# Patient Record
Sex: Female | Born: 1940 | Race: White | Hispanic: No | Smoking: Never smoker
Health system: Southern US, Community
[De-identification: ages and names within clinical notes are randomized; demographics above are authoritative.]

## PROBLEM LIST (undated history)

## (undated) DIAGNOSIS — U071 COVID-19: Secondary | ICD-10-CM

## (undated) DIAGNOSIS — E785 Hyperlipidemia, unspecified: Secondary | ICD-10-CM

## (undated) DIAGNOSIS — T8859XA Other complications of anesthesia, initial encounter: Secondary | ICD-10-CM

## (undated) DIAGNOSIS — O039 Complete or unspecified spontaneous abortion without complication: Secondary | ICD-10-CM

## (undated) DIAGNOSIS — F329 Major depressive disorder, single episode, unspecified: Secondary | ICD-10-CM

## (undated) DIAGNOSIS — B2790 Infectious mononucleosis, unspecified without complication: Secondary | ICD-10-CM

## (undated) DIAGNOSIS — C4490 Unspecified malignant neoplasm of skin, unspecified: Secondary | ICD-10-CM

## (undated) DIAGNOSIS — A281 Cat-scratch disease: Secondary | ICD-10-CM

## (undated) DIAGNOSIS — T4145XA Adverse effect of unspecified anesthetic, initial encounter: Secondary | ICD-10-CM

## (undated) DIAGNOSIS — B059 Measles without complication: Secondary | ICD-10-CM

## (undated) DIAGNOSIS — M810 Age-related osteoporosis without current pathological fracture: Secondary | ICD-10-CM

## (undated) DIAGNOSIS — N390 Urinary tract infection, site not specified: Secondary | ICD-10-CM

## (undated) DIAGNOSIS — R5382 Chronic fatigue, unspecified: Secondary | ICD-10-CM

## (undated) DIAGNOSIS — K311 Adult hypertrophic pyloric stenosis: Secondary | ICD-10-CM

## (undated) DIAGNOSIS — K5190 Ulcerative colitis, unspecified, without complications: Secondary | ICD-10-CM

## (undated) DIAGNOSIS — J329 Chronic sinusitis, unspecified: Secondary | ICD-10-CM

## (undated) DIAGNOSIS — K51919 Ulcerative colitis, unspecified with unspecified complications: Secondary | ICD-10-CM

## (undated) DIAGNOSIS — G43709 Chronic migraine without aura, not intractable, without status migrainosus: Secondary | ICD-10-CM

## (undated) DIAGNOSIS — R7303 Prediabetes: Secondary | ICD-10-CM

## (undated) DIAGNOSIS — Z6711 Type A blood, Rh negative: Secondary | ICD-10-CM

## (undated) DIAGNOSIS — F32A Depression, unspecified: Secondary | ICD-10-CM

## (undated) DIAGNOSIS — K219 Gastro-esophageal reflux disease without esophagitis: Secondary | ICD-10-CM

## (undated) DIAGNOSIS — K529 Noninfective gastroenteritis and colitis, unspecified: Secondary | ICD-10-CM

## (undated) DIAGNOSIS — IMO0002 Reserved for concepts with insufficient information to code with codable children: Secondary | ICD-10-CM

## (undated) DIAGNOSIS — N809 Endometriosis, unspecified: Secondary | ICD-10-CM

## (undated) HISTORY — DX: Depression, unspecified: F32.A

## (undated) HISTORY — DX: Major depressive disorder, single episode, unspecified: F32.9

## (undated) HISTORY — DX: Chronic migraine without aura, not intractable, without status migrainosus: G43.709

## (undated) HISTORY — DX: COVID-19: U07.1

## (undated) HISTORY — PX: CATARACT EXTRACTION: SUR2

## (undated) HISTORY — DX: Unspecified malignant neoplasm of skin, unspecified: C44.90

## (undated) HISTORY — DX: Ulcerative colitis, unspecified, without complications: K51.90

## (undated) HISTORY — PX: OTHER SURGICAL HISTORY: SHX169

## (undated) HISTORY — DX: Infectious mononucleosis, unspecified without complication: B27.90

## (undated) HISTORY — DX: Noninfective gastroenteritis and colitis, unspecified: K52.9

## (undated) HISTORY — DX: Complete or unspecified spontaneous abortion without complication: O03.9

## (undated) HISTORY — DX: Gastro-esophageal reflux disease without esophagitis: K21.9

## (undated) HISTORY — DX: Cat-scratch disease: A28.1

## (undated) HISTORY — DX: Chronic sinusitis, unspecified: J32.9

## (undated) HISTORY — DX: Hyperlipidemia, unspecified: E78.5

## (undated) HISTORY — DX: Hemochromatosis, unspecified: E83.119

## (undated) HISTORY — DX: Age-related osteoporosis without current pathological fracture: M81.0

## (undated) HISTORY — DX: Type A blood, Rh negative: Z67.11

## (undated) HISTORY — DX: Adult hypertrophic pyloric stenosis: K31.1

## (undated) HISTORY — DX: Prediabetes: R73.03

## (undated) HISTORY — DX: Urinary tract infection, site not specified: N39.0

## (undated) HISTORY — DX: Measles without complication: B05.9

## (undated) HISTORY — DX: Ulcerative colitis, unspecified with unspecified complications: K51.919

## (undated) HISTORY — DX: Chronic fatigue, unspecified: R53.82

## (undated) HISTORY — DX: Reserved for concepts with insufficient information to code with codable children: IMO0002

## (undated) HISTORY — DX: Endometriosis, unspecified: N80.9

---

## 2005-02-04 ENCOUNTER — Ambulatory Visit: Payer: Self-pay | Admitting: Internal Medicine

## 2007-01-12 ENCOUNTER — Ambulatory Visit: Payer: Self-pay | Admitting: Obstetrics and Gynecology

## 2008-01-16 ENCOUNTER — Ambulatory Visit: Payer: Self-pay | Admitting: Obstetrics and Gynecology

## 2008-07-19 DIAGNOSIS — C4492 Squamous cell carcinoma of skin, unspecified: Secondary | ICD-10-CM

## 2008-07-19 HISTORY — DX: Squamous cell carcinoma of skin, unspecified: C44.92

## 2008-07-24 ENCOUNTER — Ambulatory Visit: Payer: Self-pay | Admitting: Gastroenterology

## 2009-01-22 ENCOUNTER — Ambulatory Visit: Payer: Self-pay | Admitting: Obstetrics and Gynecology

## 2010-06-29 ENCOUNTER — Ambulatory Visit: Payer: Self-pay | Admitting: Obstetrics and Gynecology

## 2011-05-27 ENCOUNTER — Ambulatory Visit: Payer: Self-pay | Admitting: Ophthalmology

## 2011-06-07 ENCOUNTER — Ambulatory Visit: Payer: Self-pay | Admitting: Ophthalmology

## 2011-06-21 ENCOUNTER — Ambulatory Visit: Payer: Self-pay | Admitting: Ophthalmology

## 2011-09-16 ENCOUNTER — Ambulatory Visit: Payer: Self-pay | Admitting: Obstetrics and Gynecology

## 2014-04-23 DIAGNOSIS — C4491 Basal cell carcinoma of skin, unspecified: Secondary | ICD-10-CM

## 2014-04-23 HISTORY — DX: Basal cell carcinoma of skin, unspecified: C44.91

## 2014-09-01 NOTE — Op Note (Signed)
PATIENT NAME:  Jamie Todd, Jamie Todd MR#:  [id number] DATE OF BIRTH:  August 20, 1940  DATE OF PROCEDURE:  06/07/2011  PREOPERATIVE DIAGNOSIS:  Cataract, right eye.   POSTOPERATIVE DIAGNOSIS:  Cataract, right eye.  PROCEDURE PERFORMED: Extracapsular cataract extraction using phacoemulsification with placement of an Alcon SN6CWS, 11.0-diopter posterior chamber lens, serial # [id number].  SURGEON:  Loura Back. Maple Odaniel, MD  ASSISTANT:  None.  ANESTHESIA:  4% lidocaine and 0.75% Marcaine in a 50/50 mixture with 10 units per mL of Hylenex added, given as a peribulbar.  ANESTHESIOLOGIST:  Dr. Marcello Moores   COMPLICATIONS:  None.  ESTIMATED BLOOD LOSS:  Less than 1 mL.  DESCRIPTION OF PROCEDURE:  The patient was brought to the operating room and given a peribulbar block.  The patient was then prepped and draped in the usual fashion.  The vertical rectus muscles were imbricated using 5-0 silk sutures.  These sutures were then clamped to the sterile drapes as bridle sutures.  A limbal peritomy was performed extending two clock hours and hemostasis was obtained with cautery.  A partial thickness scleral groove was made at the surgical limbus and dissected anteriorly in a lamellar dissection using an Alcon crescent knife.  The anterior chamber was entered superonasally with a Superblade and through the lamellar dissection with a 2.6 mm keratome.  DisCoVisc was used to replace the aqueous and a continuous tear capsulorrhexis was carried out.  Hydrodissection and hydrodelineation were carried out with balanced salt and a 27 gauge canula.  The nucleus was rotated to confirm the effectiveness of the hydrodissection.  Phacoemulsification was carried out using a divide-and-conquer technique.  Total ultrasound time was 1 minute and 50 seconds with an average power of 20.9 percent.  Irrigation/aspiration was used to remove the residual cortex.  DisCoVisc was used to inflate the capsule and the internal incision was  enlarged to 3 mm with the crescent knife.  The intraocular lens was folded and inserted into the capsular bag using the Acrysert delivery system.  Irrigation/aspiration was used to remove the residual DisCoVisc.  Miostat was injected into the anterior chamber through the paracentesis track to inflate the anterior chamber and induce miosis.  The wound was checked for leaks and none were found. The conjunctiva was closed with cautery and the bridle sutures were removed.  Two drops of 0.3% Vigamox were placed on the eye.   An eye shield was placed on the eye.  The patient was discharged to the recovery room in good condition.  ____________________________ Loura Back Jahseh Lucchese, MD sad:drc D: 06/07/2011 13:11:02 ET T: 06/07/2011 13:43:50 ET JOB#: [id number]  cc: Remo Lipps A. Taiesha Bovard, MD, <Dictator> Martie Lee MD ELECTRONICALLY SIGNED 06/14/2011 12:24

## 2014-09-01 NOTE — Op Note (Signed)
PATIENT NAME:  Jamie Todd, Jamie Todd MR#:  [id number] DATE OF BIRTH:  1940-05-19  DATE OF PROCEDURE:  06/21/2011  PREOPERATIVE DIAGNOSIS:  Cataract, left eye.    POSTOPERATIVE DIAGNOSIS:  Cataract, left eye.  PROCEDURE PERFORMED:  Extracapsular cataract extraction using phacoemulsification with placement of an Alcon SN6CWS 11.5-diopter posterior chamber lens, serial # [id number].  SURGEON:  Loura Back. Milica Gully, MD  ASSISTANT:  None.  ANESTHESIA:  4% lidocaine and 0.75% Marcaine in a 50/50 mixture with 10 units/mL of Hylenex added, given as a peribulbar per mL.  ANESTHESIOLOGIST:  Dr. Benjamine Mola.  COMPLICATIONS:  None.  ESTIMATED BLOOD LOSS:  Less than 1 mL.  DESCRIPTION OF PROCEDURE:  The patient was brought to the operating room and given a peribulbar block.  The patient was then prepped and draped in the usual fashion.  The vertical rectus muscles were imbricated using 5-0 silk sutures.  These sutures were then clamped to the sterile drapes as bridle sutures.  A limbal peritomy was performed extending two clock hours and hemostasis was obtained with cautery.  A partial thickness scleral groove was made at the surgical limbus and dissected anteriorly in a lamellar dissection using an Alcon crescent knife.  The anterior chamber was entered supero-temporally with a Superblade and through the lamellar dissection with a 2.6 mm keratome.  DisCoVisc was used to replace the aqueous and a continuous tear capsulorrhexis was carried out.  Hydrodissection and hydrodelineation were carried out with balanced salt and a 27 gauge canula.  The nucleus was rotated to confirm the effectiveness of the hydrodissection.  Phacoemulsification was carried out using a divide-and-conquer technique.  Total ultrasound time was 1 minute and 24 seconds with an average power of 19.9 percent.  Irrigation/aspiration was used to remove the residual cortex.  DisCoVisc was used to inflate the capsule and the internal incision was  enlarged to 3 mm with the crescent knife.  The intraocular lens was folded and inserted into the capsular bag using the AcrySert delivery system.  Irrigation/aspiration was used to remove the residual DisCoVisc.  Miostat was injected into the anterior chamber through the paracentesis track to inflate the anterior chamber and induce miosis.  The wound was checked for leaks and none were found. The conjunctiva was closed with cautery and the bridle sutures were removed.  Two drops of 0.3% Vigamox were placed on the eye.   An eye shield was placed on the eye.  The patient was discharged to the recovery room in good condition.  ____________________________ Loura Back Neo Yepiz, MD sad:cms D: 06/21/2011 13:04:26 ET T: 06/21/2011 13:40:49 ET JOB#: [id number]  cc: Remo Lipps A. Ameriah Lint, MD, <Dictator> Martie Lee MD ELECTRONICALLY SIGNED 06/28/2011 13:44

## 2017-05-27 ENCOUNTER — Ambulatory Visit: Payer: Medicare Other | Admitting: Internal Medicine

## 2017-05-27 VITALS — BP 140/78 | HR 100 | Temp 98.5°F | Resp 16 | Ht 60.0 in | Wt 119.2 lb

## 2017-05-27 DIAGNOSIS — Z87898 Personal history of other specified conditions: Secondary | ICD-10-CM | POA: Diagnosis not present

## 2017-05-27 DIAGNOSIS — N644 Mastodynia: Secondary | ICD-10-CM

## 2017-05-27 DIAGNOSIS — Z8719 Personal history of other diseases of the digestive system: Secondary | ICD-10-CM | POA: Diagnosis not present

## 2017-05-27 DIAGNOSIS — R5382 Chronic fatigue, unspecified: Secondary | ICD-10-CM

## 2017-05-27 DIAGNOSIS — E559 Vitamin D deficiency, unspecified: Secondary | ICD-10-CM

## 2017-05-27 DIAGNOSIS — G4700 Insomnia, unspecified: Secondary | ICD-10-CM

## 2017-05-27 DIAGNOSIS — Z1159 Encounter for screening for other viral diseases: Secondary | ICD-10-CM | POA: Diagnosis not present

## 2017-05-27 DIAGNOSIS — Z85828 Personal history of other malignant neoplasm of skin: Secondary | ICD-10-CM | POA: Diagnosis not present

## 2017-05-27 DIAGNOSIS — Z13818 Encounter for screening for other digestive system disorders: Secondary | ICD-10-CM

## 2017-05-27 DIAGNOSIS — M810 Age-related osteoporosis without current pathological fracture: Secondary | ICD-10-CM

## 2017-05-27 DIAGNOSIS — K219 Gastro-esophageal reflux disease without esophagitis: Secondary | ICD-10-CM

## 2017-05-27 DIAGNOSIS — J329 Chronic sinusitis, unspecified: Secondary | ICD-10-CM | POA: Diagnosis not present

## 2017-05-27 DIAGNOSIS — R6883 Chills (without fever): Secondary | ICD-10-CM

## 2017-05-27 DIAGNOSIS — Z13220 Encounter for screening for lipoid disorders: Secondary | ICD-10-CM | POA: Diagnosis not present

## 2017-05-27 NOTE — Patient Instructions (Addendum)
We will refer you for mammogram, bone density and colonoscopy with Dr. Allen Norris Fasting labs Monday schedule at check out  no food only water x 12 hours  F/u in 4-6 weeks  Take care  Please stop Zyrtec D only do Zyrtec at night  Stress and Stress Management Stress is a normal reaction to life events. It is what you feel when life demands more than you are used to or more than you can handle. Some stress can be useful. For example, the stress reaction can help you catch the last bus of the day, study for a test, or meet a deadline at work. But stress that occurs too often or for too long can cause problems. It can affect your emotional health and interfere with relationships and normal daily activities. Too much stress can weaken your immune system and increase your risk for physical illness. If you already have a medical problem, stress can make it worse. What are the causes? All sorts of life events may cause stress. An event that causes stress for one person may not be stressful for another person. Major life events commonly cause stress. These may be positive or negative. Examples include losing your job, moving into a new home, getting married, having a baby, or losing a loved one. Less obvious life events may also cause stress, especially if they occur day after day or in combination. Examples include working long hours, driving in traffic, caring for children, being in debt, or being in a difficult relationship. What are the signs or symptoms? Stress may cause emotional symptoms including, the following:  Anxiety. This is feeling worried, afraid, on edge, overwhelmed, or out of control.  Anger. This is feeling irritated or impatient.  Depression. This is feeling sad, down, helpless, or guilty.  Difficulty focusing, remembering, or making decisions.  Stress may cause physical symptoms, including the following:  Aches and pains. These may affect your head, neck, back, stomach, or other areas  of your body.  Tight muscles or clenched jaw.  Low energy or trouble sleeping.  Stress may cause unhealthy behaviors, including the following:  Eating to feel better (overeating) or skipping meals.  Sleeping too little, too much, or both.  Working too much or putting off tasks (procrastination).  Smoking, drinking alcohol, or using drugs to feel better.  How is this diagnosed? Stress is diagnosed through an assessment by your health care provider. Your health care provider will ask questions about your symptoms and any stressful life events.Your health care provider will also ask about your medical history and may order blood tests or other tests. Certain medical conditions and medicine can cause physical symptoms similar to stress. Mental illness can cause emotional symptoms and unhealthy behaviors similar to stress. Your health care provider may refer you to a mental health professional for further evaluation. How is this treated? Stress management is the recommended treatment for stress.The goals of stress management are reducing stressful life events and coping with stress in healthy ways. Techniques for reducing stressful life events include the following:  Stress identification. Self-monitor for stress and identify what causes stress for you. These skills may help you to avoid some stressful events.  Time management. Set your priorities, keep a calendar of events, and learn to say "no." These tools can help you avoid making too many commitments.  Techniques for coping with stress include the following:  Rethinking the problem. Try to think realistically about stressful events rather than ignoring them or overreacting. Try to  find the positives in a stressful situation rather than focusing on the negatives.  Exercise. Physical exercise can release both physical and emotional tension. The key is to find a form of exercise you enjoy and do it regularly.  Relaxation techniques.  These relax the body and mind. Examples include yoga, meditation, tai chi, biofeedback, deep breathing, progressive muscle relaxation, listening to music, being out in nature, journaling, and other hobbies. Again, the key is to find one or more that you enjoy and can do regularly.  Healthy lifestyle. Eat a balanced diet, get plenty of sleep, and do not smoke. Avoid using alcohol or drugs to relax.  Strong support network. Spend time with family, friends, or other people you enjoy being around.Express your feelings and talk things over with someone you trust.  Counseling or talktherapy with a mental health professional may be helpful if you are having difficulty managing stress on your own. Medicine is typically not recommended for the treatment of stress.Talk to your health care provider if you think you need medicine for symptoms of stress. Follow these instructions at home:  Keep all follow-up visits as directed by your health care provider.  Take all medicines as directed by your health care provider. Contact a health care provider if:  Your symptoms get worse or you start having new symptoms.  You feel overwhelmed by your problems and can no longer manage them on your own. Get help right away if:  You feel like hurting yourself or someone else. This information is not intended to replace advice given to you by your health care provider. Make sure you discuss any questions you have with your health care provider. Document Released: 10/20/2000 Document Revised: 10/02/2015 Document Reviewed: 12/19/2012 Elsevier Interactive Patient Education  2017 Else[employer] Inc.  Cholesterol Cholesterol is a white, waxy, fat-like substance that is needed by the human body in small amounts. The liver makes all the cholesterol we need. Cholesterol is carried from the liver by the blood through the blood vessels. Deposits of cholesterol (plaques) may build up on blood vessel (artery) walls. Plaques make the  arteries narrower and stiffer. Cholesterol plaques increase the risk for heart attack and stroke. You cannot feel your cholesterol level even if it is very high. The only way to know that it is high is to have a blood test. Once you know your cholesterol levels, you should keep a record of the test results. Work with your health care provider to keep your levels in the desired range. What do the results mean?  Total cholesterol is a rough measure of all the cholesterol in your blood.  LDL (low-density lipoprotein) is the "bad" cholesterol. This is the type that causes plaque to build up on the artery walls. You want this level to be low.  HDL (high-density lipoprotein) is the "good" cholesterol because it cleans the arteries and carries the LDL away. You want this level to be high.  Triglycerides are fat that the body can either burn for energy or store. High levels are closely linked to heart disease. What are the desired levels of cholesterol?  Total cholesterol below 200.  LDL below 100 for people who are at risk, below 70 for people at very high risk.  HDL above 40 is good. A level of 60 or higher is considered to be protective against heart disease.  Triglycerides below 150. How can I lower my cholesterol? Diet Follow your diet program as told by your health care provider.  Choose fish  or white meat chicken and [place], roasted or baked. Limit fatty cuts of red meat, fried foods, and processed meats, such as sausage and lunch meats.  Eat lots of fresh fruits and vegetables.  Choose whole grains, beans, pasta, potatoes, and cereals.  Choose olive oil, corn oil, or canola oil, and use only small amounts.  Avoid butter, mayonnaise, shortening, or palm kernel oils.  Avoid foods with trans fats.  Drink skim or nonfat milk and eat low-fat or nonfat yogurt and cheeses. Avoid whole milk, cream, ice cream, egg yolks, and full-fat cheeses.  Healthier desserts include angel food cake,  ginger snaps, animal crackers, hard candy, popsicles, and low-fat or nonfat frozen yogurt. Avoid pastries, cakes, pies, and cookies.  Exercise  Follow your exercise program as told by your health care provider. A regular program: ? Helps to decrease LDL and raise HDL. ? Helps with weight control.  Do things that increase your activity level, such as gardening, walking, and taking the stairs.  Ask your health care provider about ways that you can be more active in your daily life.  Medicine  Take over-the-counter and prescription medicines only as told by your health care provider. ? Medicine may be prescribed by your health care provider to help lower cholesterol and decrease the risk for heart disease. This is usually done if diet and exercise have failed to bring down cholesterol levels. ? If you have several risk factors, you may need medicine even if your levels are normal.  This information is not intended to replace advice given to you by your health care provider. Make sure you discuss any questions you have with your health care provider. Document Released: 01/19/2001 Document Revised: 11/22/2015 Document Reviewed: 10/25/2015 Elsevier Interactive Patient Education  [employer].

## 2017-05-31 ENCOUNTER — Encounter: Payer: Self-pay | Admitting: Internal Medicine

## 2017-05-31 DIAGNOSIS — R7303 Prediabetes: Secondary | ICD-10-CM | POA: Insufficient documentation

## 2017-05-31 DIAGNOSIS — J329 Chronic sinusitis, unspecified: Secondary | ICD-10-CM | POA: Insufficient documentation

## 2017-05-31 DIAGNOSIS — G9332 Myalgic encephalomyelitis/chronic fatigue syndrome: Secondary | ICD-10-CM | POA: Insufficient documentation

## 2017-05-31 DIAGNOSIS — N644 Mastodynia: Secondary | ICD-10-CM | POA: Insufficient documentation

## 2017-05-31 DIAGNOSIS — M810 Age-related osteoporosis without current pathological fracture: Secondary | ICD-10-CM

## 2017-05-31 DIAGNOSIS — Z85828 Personal history of other malignant neoplasm of skin: Secondary | ICD-10-CM | POA: Insufficient documentation

## 2017-05-31 DIAGNOSIS — R5382 Chronic fatigue, unspecified: Secondary | ICD-10-CM | POA: Insufficient documentation

## 2017-05-31 HISTORY — DX: Mastodynia: N64.4

## 2017-05-31 LAB — POC INFLUENZA A&B (BINAX/QUICKVUE)
Influenza A, POC: NEGATIVE
Influenza B, POC: NEGATIVE

## 2017-05-31 NOTE — Progress Notes (Signed)
Chief Complaint  Patient presents with  . Establish Care   Establish care  1. She c/o chronic sinus issues follows with Dr. Richardson Landry ENT on Singulair, Flonase am and pm 1 spray, Zyretec D DM, Mucinex 12 hr.  2. Fatigue, body aches, runny nose today. Yesterday she had fever, nausea and felt achy.  3. 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 She c/o increased stress exhaustion, chronic fatigue dx'ed in 1982 per pt which has been worse w/in the last year 4. H/o osteoporosis she never tried Prolia. Last DEXA 3 years ago  5. She reports right breast pain and hit right breast x 2 few months ago with increased pain    Review of Systems  Constitutional: Positive for chills, fever and malaise/fatigue.  HENT: Positive for sinus pain. Negative for hearing loss.   Eyes:       No vision issues   Respiratory: Negative for shortness of breath.   Cardiovascular: Negative for chest pain.  Gastrointestinal: Positive for nausea.  Musculoskeletal: Negative for falls.  Skin: Negative for rash.  Neurological: Negative for headaches.  Psychiatric/Behavioral: Negative for memory loss.       +stress    Past Medical History:  Diagnosis Date  . Cat scratch fever   . Chronic fatigue   . Chronic migraine   . Chronic sinusitis   . Depression   . Endometriosis   . GERD (gastroesophageal reflux disease)   . Hemochromatosis   . Hyperlipidemia   . Measles   . Miscarriage   . Osteoporosis   . Prediabetes   . Pyloric stenosis   . Skin cancer    basal and squamous cell   . Type A blood, Rh negative   . Ulcerative colitis ([place])   . UTI (urinary tract infection)    Past Surgical History:  Procedure Laterality Date  . CATARACT EXTRACTION     b/l  . OTHER SURGICAL HISTORY     laparoscopy several, D&Cs 1970s/1980s    Family History  Problem Relation Age of Onset  . Dementia Mother   .  Diabetes Other        both sides of family    Social History   Socioeconomic History  . Marital status: Divorced    Spouse name: Not on file  . Number of children: Not on file  . Years of education: Not on file  . Highest education level: Not on file  Social Needs  . Financial resource strain: Not on file  . Food insecurity - worry: Not on file  . Food insecurity - inability: Not on file  . Transportation needs - medical: Not on file  . Transportation needs - non-medical: Not on file  Occupational History  . Not on file  Tobacco Use  . Smoking status: Never Smoker  . Smokeless tobacco: Never Used  Substance and Sexual Activity  . Alcohol use: Yes    Comment: ocass  . Drug use: No  . Sexual activity: No  Other Topics Concern  . 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 cetirizine-pseudoephedrine (ZYRTEC-D) 5-120 MG tablet Take 1 tablet by mouth daily.   No Known Allergies No results found  for this or any previous visit (from the past 2160 hour(s)). Objective  Body mass index is 23.29 kg/m. Wt Readings from Last 3 Encounters:  05/27/17 119 lb 4 oz (54.1 kg)   Temp Readings from Last 3 Encounters:  05/27/17 98.5 F (36.9 C) (Oral)   BP Readings from Last 3 Encounters:  05/27/17 140/78   Pulse Readings from Last 3 Encounters:  05/27/17 100   O2 sat roo mair 96%   Physical Exam  Constitutional: She is oriented to person, place, and time and well-developed, well-nourished, and in no distress. Vital signs are normal.  HENT:  Head: Normocephalic.  Mouth/Throat: Oropharynx is clear and moist and mucous membranes are normal.  Eyes: Conjunctivae are normal. Pupils are equal, round, and reactive to light.  Cardiovascular: Normal rate, regular rhythm and normal heart sounds.  Pulmonary/Chest: Effort normal and breath  sounds normal.  Abdominal: Soft. Bowel sounds are normal.  Neurological: She is alert and oriented to person, place, and time. Gait normal. Gait normal.  Skin: Skin is warm, dry and intact.  Psychiatric: Mood, memory, affect and judgment normal.  Nursing note and vitals reviewed.   Assessment   1. Likely URI r/o flu negative today  2. Chronic sinusitis  3. Chronic fatigue  4. H/o hemachromatosis  5. H/o skin cancer  6. Right breast pain  7. HM Plan  1.  Supportive care  2.  F/u ENT prn Dr. Richardson Landry  rec stop Zyrtec D and do regular formulation  3. Check labs CMET, CBC, UA, lipid, TSH, T4, iron labs, vit D, Hep B/C, A1C 4. Check iron labs  5. H/o BCC right leg follows also h/o SCC [place] skin due 09/2017 6.  Will do dx b/l mammo 7.  Had flu shot Tdap 2017/2018 [place]  Prevnar, pna 23, had zostavax shingrix 2/2 would have [place] need to confirm  Last pap 2015  Referred 3 D b/l diagnostic mammo With h/o UC last colonoscopy >10 years ago referred to Dr. Allen Norris dEXA 3 years ago with h/o osteoporosis needs repeat   Dr. Thomasene Ripple Dr. Carman Ching [facility]  ENT Dr. Richardson Landry Ob/GYN [facility] Provider: Dr. Olivia Mackie McLean-Scocuzza-[facility]

## 2017-06-17 ENCOUNTER — Ambulatory Visit: Payer: Medicare Other | Admitting: Gastroenterology

## 2017-06-28 ENCOUNTER — Ambulatory Visit: Payer: Medicare Other | Admitting: Internal Medicine

## 2017-07-25 ENCOUNTER — Encounter: Payer: Self-pay | Admitting: Gastroenterology

## 2017-07-25 ENCOUNTER — Other Ambulatory Visit: Payer: Self-pay

## 2017-07-25 ENCOUNTER — Encounter (INDEPENDENT_AMBULATORY_CARE_PROVIDER_SITE_OTHER): Payer: Self-pay

## 2017-07-25 ENCOUNTER — Ambulatory Visit (INDEPENDENT_AMBULATORY_CARE_PROVIDER_SITE_OTHER): Payer: Medicare Other | Admitting: Gastroenterology

## 2017-07-25 VITALS — BP 145/91 | HR 119 | Ht 60.0 in | Wt 119.5 lb

## 2017-07-25 DIAGNOSIS — K51919 Ulcerative colitis, unspecified with unspecified complications: Secondary | ICD-10-CM

## 2017-07-25 NOTE — Progress Notes (Signed)
Gastroenterology Consultation  Referring Provider:     McLean-Scocuzza, Jamie Mackie * Primary Care Physician:  Todd, Jamie Glow, MD Primary Gastroenterologist:  Dr. Allen Todd     Reason for Consultation:     Ulcerative colitis        HPI:   Jamie Todd is a 77 y.o. y/o female referred for consultation & management of ulcerative colitis by Dr. Terese Todd, Jamie Glow, MD.  This patient was sent to me by her primary care physician for a history of ulcerative colitis.  The patient appears to have had a colonoscopy by me back in 2010.  The patient appears to have had a bunch of labs ordered by her primary care physician in January of this year none of which appears to have been completed.  There is also a report in the chart that the patient has hemochromatosis.  She does not appear to be any recent follow-up for her ulcerative colitis or possible hemochromatosis.  The patient has never been treated for hemochromatosis with phlebotomy.  She does report that her father had hemochromatosis.  Past Medical History:  Diagnosis Date  . Cat scratch fever   . Chronic fatigue   . Chronic migraine   . Chronic sinusitis   . Depression   . Endometriosis   . GERD (gastroesophageal reflux disease)   . Hemochromatosis   . Hyperlipidemia   . Measles   . Miscarriage   . Osteoporosis   . Prediabetes   . Pyloric stenosis   . Skin cancer    basal and squamous cell   . Type A blood, Rh negative   . Ulcerative colitis ([place])   . UTI (urinary tract infection)     Past Surgical History:  Procedure Laterality Date  . CATARACT EXTRACTION     b/l  . OTHER SURGICAL HISTORY     laparoscopy several, D&Cs 1970s/1980s     Prior to Admission medications   Medication Sig Start Date End Date Taking? Authorizing Provider  acetaminophen (TYLENOL) 325 MG tablet Take 650 mg by mouth every 6 (six) hours as needed.    [provider]  aspirin-acetaminophen-caffeine (EXCEDRIN MIGRAINE) [phone] MG  tablet Take by mouth every 6 (six) hours as needed for headache.    [provider]  cetirizine-pseudoephedrine (ZYRTEC-D) 5-120 MG tablet Take 1 tablet by mouth daily.    [provider]  dextromethorphan-guaiFENesin (MUCINEX DM) 30-600 MG 12hr tablet Take 1 tablet by mouth 2 (two) times daily.    [provider]  fluticasone Asencion Islam) 50 MCG/ACT nasal spray  04/15/17   [provider]  ibuprofen (ADVIL,MOTRIN) 200 MG tablet Take 200 mg by mouth every 6 (six) hours as needed.    [provider]  MELATONIN PO Take 6 mg by mouth.    [provider]  montelukast (SINGULAIR) 10 MG tablet  03/07/17   [provider]  ranitidine (ZANTAC) 150 MG tablet Take 150 mg by mouth 2 (two) times daily.    [provider]    Family History  Problem Relation Age of Onset  . Dementia Mother   . Diabetes Other        both sides of family      Social History   Tobacco Use  . Smoking status: Never Smoker  . Smokeless tobacco: Never Used  Substance Use Topics  . Alcohol use: Yes    Comment: ocass  . Drug use: No    Allergies as of 07/25/2017  . (No Known  Allergies)    Review of Systems:    All systems reviewed and negative except where noted in HPI.   Physical Exam:  There were no vitals taken for this visit. No LMP recorded. Psych:  Alert and cooperative. Normal mood and affect. General:   Alert,  Well-developed, well-nourished, pleasant and cooperative in NAD Head:  Normocephalic and atraumatic. Eyes:  Sclera clear, no icterus.   Conjunctiva pink. Ears:  Normal auditory acuity. Nose:  No deformity, discharge, or lesions. Mouth:  No deformity or lesions,oropharynx pink & moist. Neck:  Supple; no masses or thyromegaly. Lungs:  Respirations even and unlabored.  Clear throughout to auscultation.   No wheezes, crackles, or rhonchi. No acute distress. Heart:  Regular rate and rhythm; no murmurs, clicks, rubs, or  gallops. Abdomen:  Normal bowel sounds.  No bruits.  Soft, non-tender and non-distended without masses, hepatosplenomegaly or hernias noted.  No guarding or rebound tenderness.  Negative Carnett sign.   Rectal:  Deferred.  Msk:  Symmetrical without gross deformities.  Good, equal movement & strength bilaterally. Pulses:  Normal pulses noted. Extremities:  No clubbing or edema.  No cyanosis. Neurologic:  Alert and oriented x3;  grossly normal neurologically. Skin:  Intact without significant lesions or rashes.  No jaundice. Lymph Nodes:  No significant cervical adenopathy. Psych:  Alert and cooperative. Normal mood and affect.  Imaging Studies: No results found.  Assessment and Plan:   Jamie Todd is a 77 y.o. y/o female has a long-standing history of ulcerative colitis and has not had follow-up in some time.  The patient also has a history suggested in the chart of having hemochromatosis.  The patient will have her labs sent off for ferritin, iron and LFTs.  She will also be set up for a surveillance colonoscopy for her ulcerative colitis. I have discussed risks & benefits which include, but are not limited to, bleeding, infection, perforation & drug reaction.  The patient agrees with this plan & written consent will be obtained.     Jamie Lame, MD. [name]   Note: This dictation was prepared with Dragon dictation along with smaller phrase technology. Any transcriptional errors that result from this process are unintentional.

## 2017-07-26 LAB — HEPATIC FUNCTION PANEL
ALBUMIN: 4.7 g/dL (ref 3.5–4.8)
ALK PHOS: 139 IU/L — AB (ref 39–117)
ALT: 26 IU/L (ref 0–32)
AST: 25 IU/L (ref 0–40)
BILIRUBIN, DIRECT: 0.07 mg/dL (ref 0.00–0.40)
Bilirubin Total: <0.2 mg/dL (ref 0.0–1.2)
TOTAL PROTEIN: 7.8 g/dL (ref 6.0–8.5)

## 2017-07-26 LAB — IRON AND TIBC
IRON SATURATION: 25 % (ref 15–55)
Iron: 90 ug/dL (ref 27–139)
Total Iron Binding Capacity: 356 ug/dL (ref 250–450)
UIBC: 266 ug/dL (ref 118–369)

## 2017-07-26 LAB — FERRITIN: Ferritin: 63 ng/mL (ref 15–150)

## 2017-07-27 ENCOUNTER — Other Ambulatory Visit: Payer: Self-pay

## 2017-07-27 DIAGNOSIS — K51919 Ulcerative colitis, unspecified with unspecified complications: Secondary | ICD-10-CM

## 2017-08-01 ENCOUNTER — Telehealth: Payer: Self-pay

## 2017-08-01 ENCOUNTER — Other Ambulatory Visit: Payer: Self-pay

## 2017-08-01 DIAGNOSIS — R748 Abnormal levels of other serum enzymes: Secondary | ICD-10-CM

## 2017-08-01 NOTE — Telephone Encounter (Signed)
Pt notified of results. Order placed for alkaline phosphatase isoenzyme.

## 2017-08-01 NOTE — Telephone Encounter (Signed)
-----   Message from Lucilla Lame, MD sent at 07/28/2017  6:08 PM EDT ----- That the patient know that her iron studies do not show any sign of her having hemachromatosis. Her liver enzymes were normal except for her alkaline phosphatase that can be from the bone or the liver.  We should have it fractionated.

## 2017-08-11 ENCOUNTER — Telehealth: Payer: Self-pay

## 2017-08-11 NOTE — Telephone Encounter (Signed)
Patient has contacted office to reschedule colonoscopy from 04/08 to 04/18 with Dr. Allen Norris at [facility] because she is working on her taxes.  Denise at [facility] has been informed of Colonoscopy date change.  Thanks [employer]

## 2017-08-22 ENCOUNTER — Encounter: Payer: Self-pay | Admitting: *Deleted

## 2017-08-22 ENCOUNTER — Other Ambulatory Visit: Payer: Self-pay

## 2017-08-24 NOTE — Discharge Instructions (Signed)
General Anesthesia, Adult, Care After °These instructions provide you with information about caring for yourself after your procedure. Your health care provider may also give you more specific instructions. Your treatment has been planned according to current medical practices, but problems sometimes occur. Call your health care provider if you have any problems or questions after your procedure. °What can I expect after the procedure? °After the procedure, it is common to have: °· Vomiting. °· A sore throat. °· Mental slowness. ° °It is common to feel: °· Nauseous. °· Cold or shivery. °· Sleepy. °· Tired. °· Sore or achy, even in parts of your body where you did not have surgery. ° °Follow these instructions at home: °For at least 24 hours after the procedure: °· Do not: °? Participate in activities where you could fall or become injured. °? Drive. °? Use heavy machinery. °? Drink alcohol. °? Take sleeping pills or medicines that cause drowsiness. °? Make important decisions or sign legal documents. °? Take care of children on your own. °· Rest. °Eating and drinking °· If you vomit, drink water, juice, or soup when you can drink without vomiting. °· Drink enough fluid to keep your urine clear or pale yellow. °· Make sure you have little or no nausea before eating solid foods. °· Follow the diet recommended by your health care provider. °General instructions °· Have a responsible adult stay with you until you are awake and alert. °· Return to your normal activities as told by your health care provider. Ask your health care provider what activities are safe for you. °· Take over-the-counter and prescription medicines only as told by your health care provider. °· If you smoke, do not smoke without supervision. °· Keep all follow-up visits as told by your health care provider. This is important. °Contact a health care provider if: °· You continue to have nausea or vomiting at home, and medicines are not helpful. °· You  cannot drink fluids or start eating again. °· You cannot urinate after 8-12 hours. °· You develop a skin rash. °· You have fever. °· You have increasing redness at the site of your procedure. °Get help right away if: °· You have difficulty breathing. °· You have chest pain. °· You have unexpected bleeding. °· You feel that you are having a life-threatening or urgent problem. °This information is not intended to replace advice given to you by your health care provider. Make sure you discuss any questions you have with your health care provider. °Document Released: 08/02/2000 Document Revised: 09/29/2015 Document Reviewed: 04/10/2015 °Elsevier Interactive Patient Education © 2018 Elsevier Inc. ° °

## 2017-08-25 ENCOUNTER — Ambulatory Visit: Payer: Medicare Other | Admitting: Anesthesiology

## 2017-08-25 ENCOUNTER — Ambulatory Visit
Admission: RE | Admit: 2017-08-25 | Discharge: 2017-08-25 | Disposition: A | Discharge status: Home / Self Care | Payer: Medicare Other | Source: Ambulatory Visit | Attending: Gastroenterology | Admitting: Gastroenterology

## 2017-08-25 ENCOUNTER — Ambulatory Visit
Admission: RE | Disposition: A | Discharge status: Home / Self Care | Payer: Self-pay | Source: Ambulatory Visit | Attending: Gastroenterology

## 2017-08-25 DIAGNOSIS — M810 Age-related osteoporosis without current pathological fracture: Secondary | ICD-10-CM | POA: Diagnosis not present

## 2017-08-25 DIAGNOSIS — K5190 Ulcerative colitis, unspecified, without complications: Secondary | ICD-10-CM | POA: Insufficient documentation

## 2017-08-25 DIAGNOSIS — K219 Gastro-esophageal reflux disease without esophagitis: Secondary | ICD-10-CM | POA: Insufficient documentation

## 2017-08-25 DIAGNOSIS — K51919 Ulcerative colitis, unspecified with unspecified complications: Secondary | ICD-10-CM | POA: Diagnosis not present

## 2017-08-25 DIAGNOSIS — R7303 Prediabetes: Secondary | ICD-10-CM | POA: Insufficient documentation

## 2017-08-25 DIAGNOSIS — F329 Major depressive disorder, single episode, unspecified: Secondary | ICD-10-CM | POA: Diagnosis not present

## 2017-08-25 DIAGNOSIS — E785 Hyperlipidemia, unspecified: Secondary | ICD-10-CM | POA: Diagnosis not present

## 2017-08-25 DIAGNOSIS — Z79899 Other long term (current) drug therapy: Secondary | ICD-10-CM | POA: Diagnosis not present

## 2017-08-25 DIAGNOSIS — K529 Noninfective gastroenteritis and colitis, unspecified: Secondary | ICD-10-CM

## 2017-08-25 DIAGNOSIS — Z7982 Long term (current) use of aspirin: Secondary | ICD-10-CM | POA: Diagnosis not present

## 2017-08-25 DIAGNOSIS — K5730 Diverticulosis of large intestine without perforation or abscess without bleeding: Secondary | ICD-10-CM | POA: Diagnosis not present

## 2017-08-25 DIAGNOSIS — Z85828 Personal history of other malignant neoplasm of skin: Secondary | ICD-10-CM | POA: Insufficient documentation

## 2017-08-25 DIAGNOSIS — J329 Chronic sinusitis, unspecified: Secondary | ICD-10-CM | POA: Diagnosis not present

## 2017-08-25 DIAGNOSIS — Z87440 Personal history of urinary (tract) infections: Secondary | ICD-10-CM | POA: Insufficient documentation

## 2017-08-25 DIAGNOSIS — R5382 Chronic fatigue, unspecified: Secondary | ICD-10-CM | POA: Diagnosis not present

## 2017-08-25 DIAGNOSIS — K6389 Other specified diseases of intestine: Secondary | ICD-10-CM

## 2017-08-25 DIAGNOSIS — Z9842 Cataract extraction status, left eye: Secondary | ICD-10-CM | POA: Diagnosis not present

## 2017-08-25 DIAGNOSIS — Z833 Family history of diabetes mellitus: Secondary | ICD-10-CM | POA: Insufficient documentation

## 2017-08-25 DIAGNOSIS — Z9841 Cataract extraction status, right eye: Secondary | ICD-10-CM | POA: Diagnosis not present

## 2017-08-25 DIAGNOSIS — G43909 Migraine, unspecified, not intractable, without status migrainosus: Secondary | ICD-10-CM | POA: Diagnosis not present

## 2017-08-25 DIAGNOSIS — Z820 Family history of epilepsy and other diseases of the nervous system: Secondary | ICD-10-CM | POA: Diagnosis not present

## 2017-08-25 DIAGNOSIS — K311 Adult hypertrophic pyloric stenosis: Secondary | ICD-10-CM | POA: Insufficient documentation

## 2017-08-25 HISTORY — PX: COLONOSCOPY WITH PROPOFOL: SHX5780

## 2017-08-25 HISTORY — DX: Other complications of anesthesia, initial encounter: T88.59XA

## 2017-08-25 HISTORY — DX: Adverse effect of unspecified anesthetic, initial encounter: T41.45XA

## 2017-08-25 SURGERY — COLONOSCOPY WITH PROPOFOL
Anesthesia: General | Wound class: Contaminated

## 2017-08-25 MED ORDER — ACETAMINOPHEN 160 MG/5ML PO SOLN: 325.0000 mg | ORAL | Status: DC | PRN

## 2017-08-25 MED ORDER — LACTATED RINGERS IV SOLN
INTRAVENOUS | Status: DC
  Administered 2017-08-25: 09:00:00 via INTRAVENOUS

## 2017-08-25 MED ORDER — ACETAMINOPHEN 325 MG PO TABS: 650.0000 mg | ORAL_TABLET | Freq: Once | ORAL | Status: DC | PRN

## 2017-08-25 MED ORDER — ONDANSETRON HCL 4 MG/2ML IJ SOLN: 4.0000 mg | Freq: Once | INTRAMUSCULAR | Status: DC | PRN

## 2017-08-25 MED ORDER — LIDOCAINE HCL (CARDIAC) PF 100 MG/5ML IV SOSY
PREFILLED_SYRINGE | INTRAVENOUS | Status: DC | PRN
  Administered 2017-08-25: 30 mg via INTRAVENOUS

## 2017-08-25 MED ORDER — PROPOFOL 10 MG/ML IV BOLUS
INTRAVENOUS | Status: DC | PRN
  Administered 2017-08-25: 50 mg via INTRAVENOUS
  Administered 2017-08-25 (×2): 30 mg via INTRAVENOUS
  Administered 2017-08-25: 20 mg via INTRAVENOUS
  Administered 2017-08-25: 100 mg via INTRAVENOUS

## 2017-08-25 MED ORDER — SODIUM CHLORIDE 0.9 % IV SOLN: INTRAVENOUS | Status: DC

## 2017-08-25 MED ORDER — LACTATED RINGERS IV SOLN
INTRAVENOUS | Status: DC
  Administered 2017-08-25: 10:00:00 via INTRAVENOUS

## 2017-08-25 SURGICAL SUPPLY — 24 items
CANISTER SUCT 1200ML W/VALVE (MISCELLANEOUS) ×2 IMPLANT
CLIP HMST 235XBRD CATH ROT (MISCELLANEOUS) IMPLANT
CLIP RESOLUTION 360 11X235 (MISCELLANEOUS)
ELECT REM PT RETURN 9FT ADLT (ELECTROSURGICAL)
ELECTRODE REM PT RTRN 9FT ADLT (ELECTROSURGICAL) IMPLANT
FCP ESCP3.2XJMB 240X2.8X (MISCELLANEOUS)
FORCEPS BIOP RAD 4 LRG CAP 4 (CUTTING FORCEPS) ×2 IMPLANT
FORCEPS BIOP RJ4 240 W/NDL (MISCELLANEOUS)
FORCEPS ESCP3.2XJMB 240X2.8X (MISCELLANEOUS) IMPLANT
GOWN CVR UNV OPN BCK APRN NK (MISCELLANEOUS) ×2 IMPLANT
GOWN ISOL THUMB LOOP REG UNIV (MISCELLANEOUS) ×2
INJECTOR VARIJECT VIN23 (MISCELLANEOUS) IMPLANT
KIT DEFENDO VALVE AND CONN (KITS) IMPLANT
KIT ENDO PROCEDURE OLY (KITS) ×2 IMPLANT
MARKER SPOT ENDO TATTOO 5ML (MISCELLANEOUS) IMPLANT
PROBE APC STR FIRE (PROBE) IMPLANT
RETRIEVER NET ROTH 2.5X230 LF (MISCELLANEOUS) IMPLANT
SNARE SHORT THROW 13M SML OVAL (MISCELLANEOUS) IMPLANT
SNARE SHORT THROW 30M LRG OVAL (MISCELLANEOUS) IMPLANT
SNARE SNG USE RND 15MM (INSTRUMENTS) IMPLANT
SPOT EX ENDOSCOPIC TATTOO (MISCELLANEOUS)
TRAP ETRAP POLY (MISCELLANEOUS) IMPLANT
VARIJECT INJECTOR VIN23 (MISCELLANEOUS)
WATER STERILE IRR 250ML POUR (IV SOLUTION) ×2 IMPLANT

## 2017-08-25 NOTE — H&P (Signed)
Lucilla Lame, MD [facility] [address], [place], [place] [address] Phone:[phone] Fax : [phone]  Primary Care Physician:  McLean-Scocuzza, Nino Glow, MD Primary Gastroenterologist:  Dr. Allen Norris  Pre-Procedure History & Physical: HPI:  Jamie Todd is a 77 y.o. female is here for an colonoscopy.   Past Medical History:  Diagnosis Date  . Cat scratch fever   . Chronic fatigue   . Chronic migraine   . Chronic sinusitis   . Complication of anesthesia    slow to wake up  . Depression   . Endometriosis   . GERD (gastroesophageal reflux disease)   . Hemochromatosis   . Hyperlipidemia   . Measles   . Miscarriage   . Osteoporosis   . Prediabetes   . Pyloric stenosis   . Skin cancer    basal and squamous cell   . Type A blood, Rh negative   . Ulcerative colitis ([place])   . UTI (urinary tract infection)     Past Surgical History:  Procedure Laterality Date  . CATARACT EXTRACTION     b/l  . OTHER SURGICAL HISTORY     laparoscopy several, D&Cs 1970s/1980s     Prior to Admission medications   Medication Sig Start Date End Date Taking? Authorizing Provider  acetaminophen (TYLENOL) 325 MG tablet Take 650 mg by mouth every 6 (six) hours as needed.   Yes [provider]  acidophilus (RISAQUAD) CAPS capsule Take by mouth daily.   Yes [provider]  aspirin-acetaminophen-caffeine (EXCEDRIN MIGRAINE) [phone] MG tablet Take by mouth every 6 (six) hours as needed for headache.   Yes [provider]  Calcium-Magnesium-Vitamin D (CALCIUM 500 PO) Take by mouth.   Yes [provider]  cetirizine-pseudoephedrine (ZYRTEC-D) 5-120 MG tablet Take 1 tablet by mouth daily.   Yes [provider]  Cranberry 1000 MG CAPS Take by mouth.   Yes [provider]  dextromethorphan-guaiFENesin (MUCINEX DM) 30-600 MG 12hr tablet Take 1 tablet by mouth 2 (two) times daily.   Yes [provider]  fluticasone Asencion Islam) 50 MCG/ACT  nasal spray  04/15/17  Yes [provider]  ibuprofen (ADVIL,MOTRIN) 200 MG tablet Take 200 mg by mouth every 6 (six) hours as needed.   Yes [provider]  MELATONIN PO Take 6 mg by mouth.   Yes [provider]  montelukast (SINGULAIR) 10 MG tablet  03/07/17  Yes [provider]  Multiple Vitamin (MULTIVITAMIN) tablet Take 1 tablet by mouth daily.   Yes [provider]  Multiple Vitamins-Minerals (ALIVE WOMENS ENERGY PO) Take by mouth.   Yes [provider]  Multiple Vitamins-Minerals (COMPLETE ENERGY PO) Take by mouth.   Yes [provider]  ranitidine (ZANTAC) 150 MG tablet Take 150 mg by mouth 2 (two) times daily.   Yes [provider]    Allergies as of 07/27/2017  . (No Known Allergies)    Family History  Problem Relation Age of Onset  . Dementia Mother   . Diabetes Other        both sides of family     Social History   Socioeconomic History  . Marital status: Divorced    Spouse name: Not on file  . Number of children: Not on file  . Years of education: Not on file  . Highest education level: Not on file  Occupational History  . Not on file  Social Needs  . Financial resource strain: Not on file  . Food insecurity:  Worry: Not on file    Inability: Not on file  . Transportation needs:    Medical: Not on file    Non-medical: Not on file  Tobacco Use  . Smoking status: Never Smoker  . Smokeless tobacco: Never Used  Substance and Sexual Activity  . Alcohol use: Yes    Comment: ocass  . Drug use: No  . Sexual activity: Never  Lifestyle  . Physical activity:    Days per week: Not on file    Minutes per session: Not on file  . Stress: Not on file  Relationships  . Social connections:    Talks on phone: Not on file    Gets together: Not on file    Attends religious service: Not on file    Active member of club or organization: Not on file    Attends meetings of clubs or organizations:  Not on file    Relationship status: Not on file  . Intimate partner violence:    Fear of current or ex partner: Not on file    Emotionally abused: Not on file    Physically abused: Not on file    Forced sexual activity: Not on file  Other Topics Concern  . 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 Neck:  Supple; no masses or thyromegaly. Lungs:  Clear throughout to auscultation.    Heart:  Regular rate and rhythm. Abdomen:  Soft, nontender and nondistended. Normal bowel sounds, without guarding, and without rebound.   Neurologic:  Alert and  oriented x4;  grossly normal neurologically.  Impression/Plan: Jamie Todd is here for an colonoscopy to be performed for Ulcerative colitis  Risks, benefits, limitations, and alternatives regarding  colonoscopy have been reviewed with the patient.  Questions have been answered.  All parties agreeable.   Lucilla Lame, MD  08/25/2017, 9:07 AM

## 2017-08-25 NOTE — Anesthesia Procedure Notes (Signed)
Performed by: Cameron Ali, CRNA Pre-anesthesia Checklist: Patient identified, Emergency Drugs available, Suction available, Timeout performed and Patient being monitored Patient Re-evaluated:Patient Re-evaluated prior to induction Oxygen Delivery Method: Nasal cannula Placement Confirmation: positive ETCO2

## 2017-08-25 NOTE — Anesthesia Preprocedure Evaluation (Signed)
Anesthesia Evaluation  Patient identified by MRN, date of birth, ID band Patient awake    Reviewed: Allergy & Precautions, NPO status , Patient's Chart, lab work & pertinent test results  History of Anesthesia Complications (+) PROLONGED EMERGENCE and history of anesthetic complications  Airway Mallampati: III  TM Distance: >3 FB Neck ROM: Full    Dental  (+)    Pulmonary neg pulmonary ROS,    Pulmonary exam normal breath sounds clear to auscultation       Cardiovascular Exercise Tolerance: Good negative cardio ROS Normal cardiovascular exam Rhythm:Regular Rate:Normal     Neuro/Psych PSYCHIATRIC DISORDERS Depression negative neurological ROS     GI/Hepatic GERD  ,Ulcerative colitis   Endo/Other  Pre-diabetes  Renal/GU negative Renal ROS     Musculoskeletal   Abdominal   Peds  Hematology negative hematology ROS (+)   Anesthesia Other Findings   Reproductive/Obstetrics                             Anesthesia Physical Anesthesia Plan  ASA: II  Anesthesia Plan: General   Post-op Pain Management:    Induction: Intravenous  PONV Risk Score and Plan: 2 and Propofol infusion and TIVA  Airway Management Planned: Natural Airway  Additional Equipment:   Intra-op Plan:   Post-operative Plan:   Informed Consent: I have reviewed the patients History and Physical, chart, labs and discussed the procedure including the risks, benefits and alternatives for the proposed anesthesia with the patient or authorized representative who has indicated his/her understanding and acceptance.     Plan Discussed with: CRNA  Anesthesia Plan Comments:         Anesthesia Quick Evaluation

## 2017-08-25 NOTE — Op Note (Signed)
[facility] Gastroenterology Patient Name: Jamie Todd Procedure Date: 08/25/2017 9:38 AM MRN: [id number] Account #: [id number] Date of Birth: 29-May-1940 Admit Type: Outpatient Age: 77 Room: [facility] OR ROOM 01 Gender: Female Note Status: Finalized Procedure:            Colonoscopy Indications:          High risk colon cancer surveillance: Ulcerative                        pancolitis Providers:            Lucilla Lame MD, MD Referring MD:         Nino Glow Mclean-Scocuzza MD, MD (Referring MD) Medicines:            Monitored Anesthesia Care, Propofol per Anesthesia Complications:        No immediate complications. Procedure:            Pre-Anesthesia Assessment:                       - Prior to the procedure, a History and Physical was                        performed, and patient medications and allergies were                        reviewed. The patient's tolerance of previous                        anesthesia was also reviewed. The risks and benefits of                        the procedure and the sedation options and risks were                        discussed with the patient. All questions were                        answered, and informed consent was obtained. Prior                        Anticoagulants: The patient has taken no previous                        anticoagulant or antiplatelet agents. ASA Grade                        Assessment: II - A patient with mild systemic disease.                        After reviewing the risks and benefits, the patient was                        deemed in satisfactory condition to undergo the                        procedure.                       After obtaining informed consent, the colonoscope was  passed under direct vision. Throughout the procedure,                        the patient's blood pressure, pulse, and oxygen                        saturations were monitored continuously. The Olympus                         Colonoscope [id number]) was introduced through the                        anus and advanced to the the cecum, identified by                        appendiceal orifice and ileocecal valve. The                        colonoscopy was performed without difficulty. The                        patient tolerated the procedure well. The quality of                        the bowel preparation was excellent. Findings:      The perianal and digital rectal examinations were normal.      Multiple small-mouthed diverticula were found in the entire colon.      A multiple scars were found in the transverse colon.      Four biopsies were taken every 10 cm with a cold forceps from the entire       colon. These biopsy specimens were sent to Pathology. Impression:           - Diverticulosis in the entire examined colon.                       - Scar in the transverse colon.                       - Biopsies for surveillance were taken from the entire                        colon. Recommendation:       - Discharge patient to home.                       - Resume previous diet.                       - Continue present medications. Procedure Code(s):    --- Professional ---                       [phone], Colonoscopy, flexible; with biopsy, single or                        multiple Diagnosis Code(s):    --- Professional ---                       K51.00, Ulcerative (chronic) pancolitis without  complications                       K63.89, Other specified diseases of intestine CPT copyright 2017 American Medical Association. All rights reserved. The codes documented in this report are preliminary and upon coder review may  be revised to meet current compliance requirements. Lucilla Lame MD, MD 08/25/2017 10:09:19 AM This report has been signed electronically. Number of Addenda: 0 Note Initiated On: 08/25/2017 9:38 AM Scope Withdrawal Time: 0 hours 7 minutes 10 seconds   Total Procedure Duration: 0 hours 14 minutes 36 seconds       [facility]

## 2017-08-25 NOTE — Transfer of Care (Signed)
Immediate Anesthesia Transfer of Care Note  Patient: Jamie Todd  Procedure(s) Performed: COLONOSCOPY WITH PROPOFOL (N/A )  Patient Location: PACU  Anesthesia Type: General  Level of Consciousness: awake, alert  and patient cooperative  Airway and Oxygen Therapy: Patient Spontanous Breathing and Patient connected to supplemental oxygen  Post-op Assessment: Post-op Vital signs reviewed, Patient's Cardiovascular Status Stable, Respiratory Function Stable, Patent Airway and No signs of Nausea or vomiting  Post-op Vital Signs: Reviewed and stable  Complications: No apparent anesthesia complications

## 2017-08-25 NOTE — Anesthesia Postprocedure Evaluation (Signed)
Anesthesia Post Note  Patient: Jamie Todd  Procedure(s) Performed: COLONOSCOPY WITH PROPOFOL (N/A )  Patient location during evaluation: PACU Anesthesia Type: General Level of consciousness: awake and alert, oriented and patient cooperative Pain management: pain level controlled Vital Signs Assessment: post-procedure vital signs reviewed and stable Respiratory status: spontaneous breathing, nonlabored ventilation and respiratory function stable Cardiovascular status: blood pressure returned to baseline and stable Postop Assessment: adequate PO intake Anesthetic complications: no    Darrin Nipper

## 2017-08-26 ENCOUNTER — Encounter: Payer: Self-pay | Admitting: Gastroenterology

## 2017-08-29 ENCOUNTER — Encounter: Payer: Self-pay | Admitting: Gastroenterology

## 2017-09-08 ENCOUNTER — Other Ambulatory Visit: Payer: Self-pay

## 2017-09-08 ENCOUNTER — Telehealth: Payer: Self-pay

## 2017-09-08 DIAGNOSIS — R748 Abnormal levels of other serum enzymes: Secondary | ICD-10-CM

## 2017-09-08 NOTE — Telephone Encounter (Signed)
-----   Message from Lucilla Lame, MD sent at 08/29/2017 12:27 PM EDT ----- Let the patient know that her pathology showed only inflammation at about 20 cm from the rectum with the rest of the colon being normal.  The patient should be set up for her next colonoscopy in 2 years.

## 2017-09-08 NOTE — Telephone Encounter (Signed)
Pt notified of pathology results. Added to 2 year recall list.

## 2018-05-18 ENCOUNTER — Encounter: Payer: Self-pay | Admitting: Internal Medicine

## 2018-05-18 LAB — HM DIABETES EYE EXAM

## 2018-07-04 ENCOUNTER — Telehealth: Payer: Self-pay | Admitting: Internal Medicine

## 2018-07-04 NOTE — Telephone Encounter (Signed)
Copied from [place] [id number]. Topic: Quick Communication - See Telephone Encounter >> Jul 04, 2018  3:51 PM Rayann Heman wrote: CRM for notification. See Telephone encounter for: 07/04/18. Pt called and stated that she has not been able to do a mammogram /bone density test/12 hour fasting blood sugar test. Pt would like to know if orders can be put in for her. Pt states that her mother passed away and has been dealing with a lot. Please advise

## 2018-07-09 ENCOUNTER — Other Ambulatory Visit: Payer: Self-pay | Admitting: Internal Medicine

## 2018-07-09 DIAGNOSIS — E2839 Other primary ovarian failure: Secondary | ICD-10-CM

## 2018-07-09 DIAGNOSIS — Z1389 Encounter for screening for other disorder: Secondary | ICD-10-CM

## 2018-07-09 DIAGNOSIS — E559 Vitamin D deficiency, unspecified: Secondary | ICD-10-CM

## 2018-07-09 DIAGNOSIS — Z1231 Encounter for screening mammogram for malignant neoplasm of breast: Secondary | ICD-10-CM

## 2018-07-09 DIAGNOSIS — Z0000 Encounter for general adult medical examination without abnormal findings: Secondary | ICD-10-CM

## 2018-07-09 DIAGNOSIS — E611 Iron deficiency: Secondary | ICD-10-CM

## 2018-07-09 DIAGNOSIS — Z1329 Encounter for screening for other suspected endocrine disorder: Secondary | ICD-10-CM

## 2018-07-09 DIAGNOSIS — Z13220 Encounter for screening for lipoid disorders: Secondary | ICD-10-CM

## 2018-07-09 DIAGNOSIS — R739 Hyperglycemia, unspecified: Secondary | ICD-10-CM

## 2018-07-09 NOTE — Telephone Encounter (Signed)
Sorry about her mother   1. Please sch fasting lab visit no food only water and meds for 8-12 hrs here  2. Given # [place] to schedule mammogram and bone density  3. Schedule f/u appt with me I have not seen her in > 1 year    [place]

## 2018-07-10 NOTE — Telephone Encounter (Signed)
Patient has been informed. Lab appointment scheduled.  She will call and scheduled fu appointment, mammogram and bone density.Marland Kitchen

## 2018-07-14 ENCOUNTER — Other Ambulatory Visit (INDEPENDENT_AMBULATORY_CARE_PROVIDER_SITE_OTHER): Payer: Medicare Other

## 2018-07-14 ENCOUNTER — Other Ambulatory Visit: Payer: Medicare Other

## 2018-07-14 DIAGNOSIS — E559 Vitamin D deficiency, unspecified: Secondary | ICD-10-CM | POA: Diagnosis not present

## 2018-07-14 DIAGNOSIS — Z0000 Encounter for general adult medical examination without abnormal findings: Secondary | ICD-10-CM

## 2018-07-14 DIAGNOSIS — Z1389 Encounter for screening for other disorder: Secondary | ICD-10-CM | POA: Diagnosis not present

## 2018-07-14 DIAGNOSIS — R739 Hyperglycemia, unspecified: Secondary | ICD-10-CM

## 2018-07-14 DIAGNOSIS — Z13220 Encounter for screening for lipoid disorders: Secondary | ICD-10-CM | POA: Diagnosis not present

## 2018-07-14 DIAGNOSIS — Z1329 Encounter for screening for other suspected endocrine disorder: Secondary | ICD-10-CM | POA: Diagnosis not present

## 2018-07-14 DIAGNOSIS — E611 Iron deficiency: Secondary | ICD-10-CM

## 2018-07-14 LAB — CBC WITH DIFFERENTIAL/PLATELET
Basophils Absolute: 0 10*3/uL (ref 0.0–0.1)
Basophils Relative: 0.3 % (ref 0.0–3.0)
Eosinophils Absolute: 0.1 10*3/uL (ref 0.0–0.7)
Eosinophils Relative: 2 % (ref 0.0–5.0)
HCT: 41 % (ref 36.0–46.0)
Hemoglobin: 13.9 g/dL (ref 12.0–15.0)
Lymphocytes Relative: 29.4 % (ref 12.0–46.0)
Lymphs Abs: 1.8 10*3/uL (ref 0.7–4.0)
MCHC: 33.9 g/dL (ref 30.0–36.0)
MCV: 89.8 fl (ref 78.0–100.0)
Monocytes Absolute: 0.4 10*3/uL (ref 0.1–1.0)
Monocytes Relative: 7.3 % (ref 3.0–12.0)
NEUTROS ABS: 3.8 10*3/uL (ref 1.4–7.7)
Neutrophils Relative %: 61 % (ref 43.0–77.0)
PLATELETS: 303 10*3/uL (ref 150.0–400.0)
RBC: 4.56 Mil/uL (ref 3.87–5.11)
RDW: 14.1 % (ref 11.5–15.5)
WBC: 6.2 10*3/uL (ref 4.0–10.5)

## 2018-07-14 LAB — LIPID PANEL
Cholesterol: 210 mg/dL — ABNORMAL HIGH (ref 0–200)
HDL: 78.3 mg/dL (ref >39.00)
LDL Cholesterol: 113 mg/dL — ABNORMAL HIGH (ref 0–99)
NonHDL: 132.05
Total CHOL/HDL Ratio: 3
Triglycerides: 97 mg/dL (ref 0.0–149.0)
VLDL: 19.4 mg/dL (ref 0.0–40.0)

## 2018-07-14 LAB — COMPREHENSIVE METABOLIC PANEL
ALT: 29 U/L (ref 0–35)
AST: 27 U/L (ref 0–37)
Albumin: 4.7 g/dL (ref 3.5–5.2)
Alkaline Phosphatase: 104 U/L (ref 39–117)
BUN: 25 mg/dL — AB (ref 6–23)
CO2: 31 mEq/L (ref 19–32)
Calcium: 10.9 mg/dL — ABNORMAL HIGH (ref 8.4–10.5)
Chloride: 101 mEq/L (ref 96–112)
Creatinine, Ser: 0.82 mg/dL (ref 0.40–1.20)
GFR: 67.91 mL/min (ref >60.00)
Glucose, Bld: 90 mg/dL (ref 70–99)
Potassium: 4.3 mEq/L (ref 3.5–5.1)
Sodium: 137 mEq/L (ref 135–145)
Total Bilirubin: 0.4 mg/dL (ref 0.2–1.2)
Total Protein: 8.2 g/dL (ref 6.0–8.3)

## 2018-07-14 LAB — TSH: TSH: 4.17 u[IU]/mL (ref 0.35–4.50)

## 2018-07-14 LAB — VITAMIN D 25 HYDROXY (VIT D DEFICIENCY, FRACTURES): VITD: 62.88 ng/mL (ref 30.00–100.00)

## 2018-07-14 LAB — HEMOGLOBIN A1C: HEMOGLOBIN A1C: 5.9 % (ref 4.6–6.5)

## 2018-07-14 NOTE — Addendum Note (Signed)
Addended by: Arby Barrette on: 07/14/2018 10:28 AM   Modules accepted: Orders

## 2018-07-15 ENCOUNTER — Other Ambulatory Visit: Payer: Self-pay | Admitting: Internal Medicine

## 2018-07-15 ENCOUNTER — Telehealth: Payer: Self-pay | Admitting: Internal Medicine

## 2018-07-15 LAB — MICROSCOPIC EXAMINATION
Bacteria, UA: NONE SEEN
Casts: NONE SEEN /lpf
RBC, UA: NONE SEEN /hpf (ref 0–2)

## 2018-07-15 LAB — URINALYSIS, ROUTINE W REFLEX MICROSCOPIC
Bilirubin, UA: NEGATIVE
GLUCOSE, UA: NEGATIVE
Ketones, UA: NEGATIVE
NITRITE UA: NEGATIVE
PH UA: 5.5 (ref 5.0–7.5)
Protein, UA: NEGATIVE
RBC, UA: NEGATIVE
Specific Gravity, UA: 1.014 (ref 1.005–1.030)
UUROB: 0.2 mg/dL (ref 0.2–1.0)

## 2018-07-15 LAB — IRON,TIBC AND FERRITIN PANEL
%SAT: 25 % (calc) (ref 16–45)
Ferritin: 25 ng/mL (ref 16–288)
Iron: 96 ug/dL (ref 45–160)
TIBC: 377 mcg/dL (calc) (ref 250–450)

## 2018-07-15 NOTE — Telephone Encounter (Signed)
SEE RESULT NOTE PT NEEDS TO SCHEDULE APPT   tms

## 2018-07-17 ENCOUNTER — Telehealth: Payer: Self-pay

## 2018-07-17 NOTE — Telephone Encounter (Signed)
-----   Message from Delorise Jackson, MD sent at 07/15/2018  8:40 PM EST ----- SHE NEEDS TO SCHEDULE A F/U APPT WITH ME NONE LISTED   Urine trace white blood cells likely contaminant  Iron normal  A1C 5.9 =prediabetes Vitamin D normal  Thyroid normal   Cholesterol elevated  -mail cholesterol handout  -THE RISK CALCULATOR REC SHE BE ON A STATIN WE CAN DISCUSS AT HER F/U APPT RISK SCORE IS 15.3 AND ABOVE 7.5% STATIN IS REC.   BUN one of kidney #s elevated rec increase water intake and avoid NSAIDS  Also Stop Zantac this was pulled from the shelves months ago due to recall of medication   Stop extra calcium and increase water intake her calcium was high  -will will repeat at f/u  Blood cts normal

## 2018-07-17 NOTE — Telephone Encounter (Signed)
Called and spoke with the pt, and informed her of her labs and appointment was scheduled for 08/09/2018 to follow up per the provider.  Gae Bon, CMA

## 2018-07-18 ENCOUNTER — Encounter: Payer: Self-pay | Admitting: Family Medicine

## 2018-07-18 ENCOUNTER — Ambulatory Visit: Payer: Medicare Other | Admitting: Family Medicine

## 2018-07-18 VITALS — BP 136/68 | HR 95 | Temp 98.6°F | Resp 18 | Ht 60.0 in | Wt 121.4 lb

## 2018-07-18 DIAGNOSIS — N644 Mastodynia: Secondary | ICD-10-CM

## 2018-07-18 NOTE — Progress Notes (Signed)
Subjective:    Patient ID: Jamie Todd, female    DOB: Mar 10, 1941, 78 y.o.   MRN: [id number]  HPI   Patient presents to clinic due to right breast pain.  Patient supposed to have screening mammogram tomorrow, mention to them that she was having right breast pain, and they suggested she be evaluated to see if order possibly need to be changed to diagnostic.  Patient states the breast pain has come and gone for many months.  Patient states she has a border quality, and also babysits of border quality, they will often jump up and tend to landed right on her right breast.  Patient is unsure if the pain in the breast is present before or after the dog jumped on her, cannot specifically differentiate the timing of the pain.  Patient denies feeling any masses in her breast while doing self breast exam, denies any nipple discharge, denies any inversion of nipples.  Patient Active Problem List   Diagnosis Date Noted  . Chronic ulcerative colitis, unspecified complication ([place])   . Idiopathic chronic inflammatory bowel disease   . Chronic sinusitis 05/31/2017  . History of prediabetes 05/31/2017  . Chronic fatigue 05/31/2017  . Hemochromatosis 05/31/2017  . Breast pain, right 05/31/2017  . H/O nonmelanoma skin cancer 05/31/2017  . Osteoporosis 05/31/2017   Social History   Tobacco Use  . Smoking status: Never Smoker  . Smokeless tobacco: Never Used  Substance Use Topics  . Alcohol use: Yes    Comment: ocass   Review of Systems  Constitutional: Negative for chills, fatigue and fever.  HENT: Negative for congestion, ear pain, sinus pain and sore throat.   Eyes: Negative.   Respiratory: Negative for cough, shortness of breath and wheezing.   Cardiovascular: Negative for chest pain, palpitations and leg swelling. Breast: Right breast pain  Gastrointestinal: Negative for abdominal pain, diarrhea, nausea and vomiting.  Genitourinary: Negative for dysuria, frequency and urgency.    Musculoskeletal: Negative for arthralgias and myalgias.  Skin: Negative for color change, pallor and rash.  Neurological: Negative for syncope, light-headedness and headaches.  Psychiatric/Behavioral: The patient is not nervous/anxious.       Objective:   Physical Exam Vitals signs and nursing note reviewed.  Constitutional:      General: She is not in acute distress.    Appearance: She is not toxic-appearing.  HENT:     Head: Normocephalic and atraumatic.  Cardiovascular:     Rate and Rhythm: Normal rate and regular rhythm.  Pulmonary:     Effort: Pulmonary effort is normal. No respiratory distress.     Breath sounds: Normal breath sounds.  Chest:     Breasts:        Right: Tenderness present. No swelling, bleeding, inverted nipple, mass, nipple discharge or skin change.        Left: Normal. No swelling, bleeding, inverted nipple, mass, nipple discharge, skin change or tenderness.     Comments: Generalized right breast tenderness with palpation. No obvious mass palpated. Skin:    General: Skin is warm and dry.     Coloration: Skin is not pale.  Neurological:     Mental Status: She is alert and oriented to person, place, and time.  Psychiatric:        Mood and Affect: Mood normal.        Behavior: Behavior normal.    Today's Vitals   07/18/18 1431  BP: 136/68  Pulse: 95  Resp: 18  Temp: 98.6 F (37  C)  TempSrc: Oral  SpO2: 98%  Weight: 121 lb 6.4 oz (55.1 kg)  Height: 5' (1.524 m)   Body mass index is 23.71 kg/m.      Assessment & Plan:    Right breast pain-due to uncertainty of pain timeframe in right breast, will change screening mammogram to diagnostic mammogram.  Ultrasound is also ordered due to the possibility of them being needed, if they are not required by radiologist; we can cancel them.  Patient encouraged to keep mammogram appointment as scheduled for tomorrow.

## 2018-07-19 ENCOUNTER — Other Ambulatory Visit: Payer: Self-pay

## 2018-07-19 ENCOUNTER — Ambulatory Visit
Admission: RE | Admit: 2018-07-19 | Discharge: 2018-07-19 | Disposition: A | Discharge status: Home / Self Care | Payer: Medicare Other | Source: Ambulatory Visit | Attending: Internal Medicine | Admitting: Internal Medicine

## 2018-07-19 DIAGNOSIS — E2839 Other primary ovarian failure: Secondary | ICD-10-CM

## 2018-07-24 ENCOUNTER — Ambulatory Visit
Admission: RE | Admit: 2018-07-24 | Discharge: 2018-07-24 | Disposition: A | Discharge status: Home / Self Care | Payer: Medicare Other | Source: Ambulatory Visit | Attending: Family Medicine | Admitting: Family Medicine

## 2018-07-24 ENCOUNTER — Other Ambulatory Visit: Payer: Self-pay | Admitting: Family Medicine

## 2018-07-24 ENCOUNTER — Other Ambulatory Visit: Payer: Self-pay

## 2018-07-24 ENCOUNTER — Telehealth: Payer: Self-pay | Admitting: Internal Medicine

## 2018-07-24 DIAGNOSIS — N644 Mastodynia: Secondary | ICD-10-CM

## 2018-07-24 NOTE — Telephone Encounter (Signed)
Copied from [place] [id number]. Topic: Quick Communication - Lab Results (Clinic Use ONLY) >> Jul 24, 2018  2:03 PM Gordy Councilman, CMA wrote: Called patient to inform them of 07/19/2018  results. When patient returns call, triage nurse may disclose results. Nina,cma    Pt calling for lab results.

## 2018-08-09 ENCOUNTER — Telehealth: Payer: Self-pay | Admitting: Internal Medicine

## 2018-08-09 ENCOUNTER — Ambulatory Visit (INDEPENDENT_AMBULATORY_CARE_PROVIDER_SITE_OTHER): Payer: Medicare Other | Admitting: Internal Medicine

## 2018-08-09 ENCOUNTER — Encounter: Payer: Self-pay | Admitting: Internal Medicine

## 2018-08-09 DIAGNOSIS — M8580 Other specified disorders of bone density and structure, unspecified site: Secondary | ICD-10-CM

## 2018-08-09 DIAGNOSIS — N644 Mastodynia: Secondary | ICD-10-CM

## 2018-08-09 DIAGNOSIS — E785 Hyperlipidemia, unspecified: Secondary | ICD-10-CM | POA: Insufficient documentation

## 2018-08-09 DIAGNOSIS — R7303 Prediabetes: Secondary | ICD-10-CM | POA: Diagnosis not present

## 2018-08-09 DIAGNOSIS — Z1159 Encounter for screening for other viral diseases: Secondary | ICD-10-CM

## 2018-08-09 DIAGNOSIS — J309 Allergic rhinitis, unspecified: Secondary | ICD-10-CM | POA: Insufficient documentation

## 2018-08-09 DIAGNOSIS — Z85828 Personal history of other malignant neoplasm of skin: Secondary | ICD-10-CM | POA: Diagnosis not present

## 2018-08-09 DIAGNOSIS — R799 Abnormal finding of blood chemistry, unspecified: Secondary | ICD-10-CM

## 2018-08-09 DIAGNOSIS — Z13818 Encounter for screening for other digestive system disorders: Secondary | ICD-10-CM

## 2018-08-09 DIAGNOSIS — R5382 Chronic fatigue, unspecified: Secondary | ICD-10-CM

## 2018-08-09 HISTORY — DX: Hypercalcemia: E83.52

## 2018-08-09 NOTE — Progress Notes (Addendum)
Telephone Note  I connected with Jamie Todd on 08/09/2018 at 2:05 pm to 2:30 PM by telephone application and verified that I am speaking with the correct person using two identifiers.  Location patient: home Location provider:work  Persons participating in the virtual visit: patient, provider  I discussed the limitations of evaluation and management by telemedicine and the availability of in person appointments. The patient expressed understanding and agreed to proceed.   HPI: 1. Reviewed labs 07/14/2018 Calcium 10.9 she is taking extra calcium due to osteopenia and vitamin D Elevated BUN she reports she only takes Ibuprofen prn and needs to drink more water  2. HLD reviewed healthy diet choices she reports at times she does not make healthy diet choices. She is trying to increase walking with FIT BIT 3. Osteopenia -2.3 T score with elevated FRAX score she was on oral bisphosphonates in the past x 5 years and calcium and vitamin D (see above) 4. Grief-mom died since last visit at 102  5. Allergies on Singulair and flonase with ENT Dr. Richardson Landry and has Rx refills she saw him w/in the last 2-4 weeks  6. She wants testing for hepatitis B/C reviewed with her that her insurance may not cover these but she wants the tests anyway will order with next labs    ROS: See pertinent positives and negatives per HPI.  Past Medical History:  Diagnosis Date  . Cat scratch fever   . Chronic fatigue   . Chronic migraine   . Chronic sinusitis   . Complication of anesthesia    slow to wake up  . Depression   . Endometriosis   . GERD (gastroesophageal reflux disease)   . Hemochromatosis   . Hyperlipidemia   . Measles   . Miscarriage   . Osteoporosis   . Prediabetes   . Pyloric stenosis   . Skin cancer    basal and squamous cell   . Type A blood, Rh negative   . Ulcerative colitis ([place])   . UTI (urinary tract infection)     Past Surgical History:  Procedure Laterality Date  . CATARACT  EXTRACTION     b/l  . COLONOSCOPY WITH PROPOFOL N/A 08/25/2017   Procedure: COLONOSCOPY WITH PROPOFOL;  Surgeon: Lucilla Lame, MD;  Location: [facility];  Service: Endoscopy;  Laterality: N/A;  wants to be last patient  . OTHER SURGICAL HISTORY     laparoscopy several, D&Cs 1970s/1980s     Family History  Problem Relation Age of Onset  . Dementia Mother   . Diabetes Other        both sides of family     SOCIAL HX:  From [place] 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, hospital   Current Outpatient Medications:  .  acetaminophen (TYLENOL) 325 MG tablet, Take 650 mg by mouth every 6 (six) hours as needed., Disp: , Rfl:  .  acidophilus (RISAQUAD) CAPS capsule, Take by mouth daily., Disp: , Rfl:  .  aspirin-acetaminophen-caffeine (EXCEDRIN MIGRAINE) 250-250-65 MG tablet, Take by mouth every 6 (six) hours as needed for headache., Disp: , Rfl:  .  Calcium-Magnesium-Vitamin D (CALCIUM 500 PO), Take by mouth., Disp: , Rfl:  .  cetirizine-pseudoephedrine (ZYRTEC-D) 5-120 MG tablet, Take 1 tablet by mouth daily., Disp: , Rfl:  .  Cranberry 1000 MG CAPS, Take by mouth., Disp: , Rfl:  .  dextromethorphan-guaiFENesin (MUCINEX DM) 30-600 MG 12hr tablet, Take 1 tablet by mouth 2 (two) times daily., Disp: , Rfl:  .  fluticasone (FLONASE) 50  MCG/ACT nasal spray, , Disp: , Rfl: 0 .  ibuprofen (ADVIL,MOTRIN) 200 MG tablet, Take 200 mg by mouth every 6 (six) hours as needed., Disp: , Rfl:  .  MELATONIN PO, Take 6 mg by mouth., Disp: , Rfl:  .  montelukast (SINGULAIR) 10 MG tablet, , Disp: , Rfl: 0 .  Multiple Vitamin (MULTIVITAMIN) tablet, Take 1 tablet by mouth daily., Disp: , Rfl:  .  Multiple Vitamins-Minerals (ALIVE WOMENS ENERGY PO), Take by mouth., Disp: , Rfl:  .  Multiple Vitamins-Minerals (COMPLETE ENERGY PO), Take by mouth., Disp: , Rfl:   EXAM: unable this was telephone visit   VITALS per patient if applicable:  GENERAL: alert, oriented, appears well and in no acute distress  PSYCH/NEURO: pleasant and  cooperative, no obvious depression or anxiety, speech and thought processing grossly intact  ASSESSMENT AND PLAN:  Discussed the following assessment and plan:  1. Hypercalcemia - Plan: Basic Metabolic Panel (BMET) 2. H/O nonmelanoma skin cancer 3. Hyperlipidemia, unspecified hyperlipidemia type 4. Elevated BUN 5. Osteopenia, unspecified location 6. Allergic rhinitis, unspecified seasonality, unspecified trigger 7. Hm   1.  Recheck Calcium upcoming reviewed labs 07/14/2018 Increase water intake  Stop otc calcium rec D3 1000 IU daily  2.  F/u dermatology Dr. Nicole Kindred 10/2018 h/o NMSC x 2  3.  Mail cholesterol handout  rec healthy diet and exercise  Hold statin for now risk score high but will monitor for now 4.  Increase water intake  5.  Will try to get prolia approved  6.  F/u ENT Dr. Richardson Landry on Singulair and flonase  7.  Per pt had flu shot this year last 02/11/17 [place] Tdap had 2017/2018 Will look for prevnar, pna 23 vaccines and shingrix had 09/07/16 and 02/23/17 [place]   Pap had 2015 mammo 07/24/2018 normal and had [place] right breast pain negative  Colonoscopy 08/25/17 diverticulosis  DEXA 07/19/2018 T score -2.3 with high FRAX score see above prolia pending approval  -rec D3 1000 IU daily  A1C 5.9 07/14/2018 prediabetes -pt wants referral for nutrition counseling     I discussed the assessment and treatment plan with the patient. The patient was provided an opportunity to ask questions and all were answered. The patient agreed with the plan and demonstrated an understanding of the instructions.   The patient was advised to call back or seek an in-person evaluation if the symptoms worsen or if the condition fails to improve as anticipated.  I provided 25 minutes of non-face-to-face time during this encounter.   Nino Glow McLean-Scocuzza, MD

## 2018-08-09 NOTE — Telephone Encounter (Signed)
Telephone Note  I connected with Jamie Todd on 08/09/2018 at 2:05 pm to 2:30 PM by telephone application and verified that I am speaking with the correct person using two identifiers.  Location patient: home Location provider:work  Persons participating in the virtual visit: patient, provider  I discussed the limitations of evaluation and management by telemedicine and the availability of in person appointments. The patient expressed understanding and agreed to proceed.   HPI: 1. Reviewed labs 07/14/2018 Calcium 10.9 she is taking extra calcium due to osteopenia and vitamin D Elevated BUN she reports she only takes Ibuprofen prn and needs to drink more water  2. HLD reviewed healthy diet choices she reports at times she does not make healthy diet choices. She is trying to increase walking with FIT BIT 3. Osteopenia -2.3 T score with elevated FRAX score she was on oral bisphosphonates in the past x 5 years and calcium and vitamin D (see above) 4. Grief-mom died since last visit at 102  5. Allergies on Singulair and flonase with ENT Dr. Richardson Landry and has Rx refills she saw him w/in the last 2-4 weeks  6. She wants testing for hepatitis B/C reviewed with her that her insurance may not cover these but she wants the tests anyway will order with next labs    ROS: See pertinent positives and negatives per HPI.  Past Medical History:  Diagnosis Date  . Cat scratch fever   . Chronic fatigue   . Chronic migraine   . Chronic sinusitis   . Complication of anesthesia    slow to wake up  . Depression   . Endometriosis   . GERD (gastroesophageal reflux disease)   . Hemochromatosis   . Hyperlipidemia   . Measles   . Miscarriage   . Osteoporosis   . Prediabetes   . Pyloric stenosis   . Skin cancer    basal and squamous cell   . Type A blood, Rh negative   . Ulcerative colitis ([place])   . UTI (urinary tract infection)     Past Surgical History:  Procedure Laterality Date  . CATARACT  EXTRACTION     b/l  . COLONOSCOPY WITH PROPOFOL N/A 08/25/2017   Procedure: COLONOSCOPY WITH PROPOFOL;  Surgeon: Lucilla Lame, MD;  Location: [facility];  Service: Endoscopy;  Laterality: N/A;  wants to be last patient  . OTHER SURGICAL HISTORY     laparoscopy several, D&Cs 1970s/1980s     Family History  Problem Relation Age of Onset  . Dementia Mother   . Diabetes Other        both sides of family     SOCIAL HX:  From 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, hospital   Current Outpatient Medications:  .  acetaminophen (TYLENOL) 325 MG tablet, Take 650 mg by mouth every 6 (six) hours as needed., Disp: , Rfl:  .  acidophilus (RISAQUAD) CAPS capsule, Take by mouth daily., Disp: , Rfl:  .  aspirin-acetaminophen-caffeine (EXCEDRIN MIGRAINE) 250-250-65 MG tablet, Take by mouth every 6 (six) hours as needed for headache., Disp: , Rfl:  .  Calcium-Magnesium-Vitamin D (CALCIUM 500 PO), Take by mouth., Disp: , Rfl:  .  cetirizine-pseudoephedrine (ZYRTEC-D) 5-120 MG tablet, Take 1 tablet by mouth daily., Disp: , Rfl:  .  Cranberry 1000 MG CAPS, Take by mouth., Disp: , Rfl:  .  dextromethorphan-guaiFENesin (MUCINEX DM) 30-600 MG 12hr tablet, Take 1 tablet by mouth 2 (two) times daily., Disp: , Rfl:  .  fluticasone (FLONASE) 50  MCG/ACT nasal spray, , Disp: , Rfl: 0 .  ibuprofen (ADVIL,MOTRIN) 200 MG tablet, Take 200 mg by mouth every 6 (six) hours as needed., Disp: , Rfl:  .  MELATONIN PO, Take 6 mg by mouth., Disp: , Rfl:  .  montelukast (SINGULAIR) 10 MG tablet, , Disp: , Rfl: 0 .  Multiple Vitamin (MULTIVITAMIN) tablet, Take 1 tablet by mouth daily., Disp: , Rfl:  .  Multiple Vitamins-Minerals (ALIVE WOMENS ENERGY PO), Take by mouth., Disp: , Rfl:  .  Multiple Vitamins-Minerals (COMPLETE ENERGY PO), Take by mouth., Disp: , Rfl:   EXAM: unable this was telephone visit   VITALS per patient if applicable:  GENERAL: alert, oriented, appears well and in no acute distress  PSYCH/NEURO: pleasant and  cooperative, no obvious depression or anxiety, speech and thought processing grossly intact  ASSESSMENT AND PLAN:  Discussed the following assessment and plan:  1. Hypercalcemia - Plan: Basic Metabolic Panel (BMET) 2. H/O nonmelanoma skin cancer 3. Hyperlipidemia, unspecified hyperlipidemia type 4. Elevated BUN 5. Osteopenia, unspecified location 6. Allergic rhinitis, unspecified seasonality, unspecified trigger 7. Hm   1.  Recheck Calcium upcoming  Increase water intake  Stop otc calcium rec D3 1000 IU daily  2.  F/u dermatology Dr. Nicole Kindred 10/2018 h/o NMSC x 2  3.  Mail cholesterol handout  rec healthy diet and exercise  Hold statin for now risk score high but will monitor for now 4.  Increase water intake  5.  Will try to get prolia approved  6.  F/u ENT Dr. Richardson Landry on Singulair and flonase  7.  Per pt had flu shot this year  Tdap had 2017/2018 Will look for prevnar, pna 23 vaccines and shingrix   Pap had 2015 mammo 07/24/2018 normal and had [place] right breast pain negative  Colonoscopy 08/25/17 diverticulosis  DEXA 07/19/2018 T score -2.3 with high FRAX score see above prolia pending approval  -rec D3 1000 IU daily  A1C 5.9 07/14/2018 prediabetes -pt wants referral for nutrition counseling     I discussed the assessment and treatment plan with the patient. The patient was provided an opportunity to ask questions and all were answered. The patient agreed with the plan and demonstrated an understanding of the instructions.   The patient was advised to call back or seek an in-person evaluation if the symptoms worsen or if the condition fails to improve as anticipated.  I provided 25 minutes of non-face-to-face time during this encounter.   Nino Glow McLean-Scocuzza, MD

## 2018-08-16 ENCOUNTER — Telehealth: Payer: Self-pay | Admitting: Internal Medicine

## 2018-08-16 NOTE — Telephone Encounter (Signed)
sch office visit 02/2019 or 03/2019   [place]

## 2018-08-25 ENCOUNTER — Telehealth: Payer: Self-pay | Admitting: *Deleted

## 2018-08-25 NOTE — Telephone Encounter (Signed)
-----   Message from Delorise Jackson, MD sent at 08/09/2018  4:20 PM EDT ----- Please set up for prolia  T score -2.3 elevated FRAX score    Thanks tMS

## 2018-08-28 NOTE — Telephone Encounter (Signed)
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

## 2018-08-29 NOTE — Telephone Encounter (Signed)
Pt has been scheduled. Thank you.

## 2018-08-31 ENCOUNTER — Telehealth: Payer: Self-pay | Admitting: Internal Medicine

## 2018-08-31 NOTE — Telephone Encounter (Signed)
Before we get prolia approved and started I want to check calcium again as was slightly elevated in 07/2018  -rec no additional calcium intake I.e calcium pills   Can she schedule lab visit?   Also hepatitis labs are in included    Thank [place]

## 2018-08-31 NOTE — Telephone Encounter (Signed)
Patient has been informed. She has appointment [phone].

## 2018-09-04 ENCOUNTER — Other Ambulatory Visit: Payer: Medicare Other

## 2018-09-05 ENCOUNTER — Emergency Department: Payer: Medicare Other

## 2018-09-05 ENCOUNTER — Other Ambulatory Visit: Payer: Self-pay

## 2018-09-05 ENCOUNTER — Other Ambulatory Visit: Payer: Medicare Other

## 2018-09-05 ENCOUNTER — Telehealth: Payer: Self-pay

## 2018-09-05 ENCOUNTER — Emergency Department
Admission: EM | Admit: 2018-09-05 | Discharge: 2018-09-05 | Disposition: A | Discharge status: Home / Self Care | Payer: Medicare Other | Attending: Emergency Medicine | Admitting: Emergency Medicine

## 2018-09-05 DIAGNOSIS — R079 Chest pain, unspecified: Secondary | ICD-10-CM

## 2018-09-05 DIAGNOSIS — Z20828 Contact with and (suspected) exposure to other viral communicable diseases: Secondary | ICD-10-CM | POA: Diagnosis not present

## 2018-09-05 DIAGNOSIS — Z85828 Personal history of other malignant neoplasm of skin: Secondary | ICD-10-CM | POA: Diagnosis not present

## 2018-09-05 DIAGNOSIS — K209 Esophagitis, unspecified without bleeding: Secondary | ICD-10-CM

## 2018-09-05 DIAGNOSIS — G43009 Migraine without aura, not intractable, without status migrainosus: Secondary | ICD-10-CM | POA: Diagnosis not present

## 2018-09-05 LAB — BASIC METABOLIC PANEL
Anion gap: 16 — ABNORMAL HIGH (ref 5–15)
BUN: 24 mg/dL — ABNORMAL HIGH (ref 8–23)
CO2: 24 mmol/L (ref 22–32)
Calcium: 10 mg/dL (ref 8.9–10.3)
Chloride: 92 mmol/L — ABNORMAL LOW (ref 98–111)
Creatinine, Ser: 0.96 mg/dL (ref 0.44–1.00)
GFR calc Af Amer: >60 mL/min (ref >60)
GFR calc non Af Amer: 58 mL/min — ABNORMAL LOW (ref >60)
Glucose, Bld: 150 mg/dL — ABNORMAL HIGH (ref 70–99)
Potassium: 3.6 mmol/L (ref 3.5–5.1)
Sodium: 132 mmol/L — ABNORMAL LOW (ref 135–145)

## 2018-09-05 LAB — CBC
HCT: 45.3 % (ref 36.0–46.0)
Hemoglobin: 15.2 g/dL — ABNORMAL HIGH (ref 12.0–15.0)
MCH: 29.7 pg (ref 26.0–34.0)
MCHC: 33.6 g/dL (ref 30.0–36.0)
MCV: 88.5 fL (ref 80.0–100.0)
Platelets: 357 10*3/uL (ref 150–400)
RBC: 5.12 MIL/uL — ABNORMAL HIGH (ref 3.87–5.11)
RDW: 12.7 % (ref 11.5–15.5)
WBC: 18.9 10*3/uL — ABNORMAL HIGH (ref 4.0–10.5)
nRBC: 0 % (ref 0.0–0.2)

## 2018-09-05 LAB — FIBRIN DERIVATIVES D-DIMER (ARMC ONLY): Fibrin derivatives D-dimer (ARMC): 1286.68 ng/mL (FEU) — ABNORMAL HIGH (ref 0.00–499.00)

## 2018-09-05 LAB — SARS CORONAVIRUS 2 BY RT PCR (HOSPITAL ORDER, PERFORMED IN ~~LOC~~ HOSPITAL LAB): SARS Coronavirus 2: NEGATIVE

## 2018-09-05 LAB — TROPONIN I: Troponin I: <0.03 ng/mL (ref <0.03)

## 2018-09-05 MED ORDER — PANTOPRAZOLE SODIUM 40 MG PO TBEC: 40.0000 mg | DELAYED_RELEASE_TABLET | Freq: Every day | ORAL | 1 refills | Status: DC

## 2018-09-05 MED ORDER — BUTALBITAL-APAP-CAFFEINE 50-325-40 MG PO TABS: 1.0000 | ORAL_TABLET | Freq: Four times a day (QID) | ORAL | 0 refills | Status: AC | PRN

## 2018-09-05 MED ORDER — ALUM & MAG HYDROXIDE-SIMETH 200-200-20 MG/5ML PO SUSP
30.0000 mL | Freq: Once | ORAL | Status: AC
  Administered 2018-09-05: 30 mL via ORAL
  Filled 2018-09-05: qty 30

## 2018-09-05 MED ORDER — METOCLOPRAMIDE HCL 5 MG/ML IJ SOLN
10.0000 mg | Freq: Once | INTRAMUSCULAR | Status: AC
  Administered 2018-09-05: 08:00:00 10 mg via INTRAVENOUS
  Filled 2018-09-05: qty 2

## 2018-09-05 MED ORDER — IOHEXOL 350 MG/ML SOLN
75.0000 mL | Freq: Once | INTRAVENOUS | Status: AC | PRN
  Administered 2018-09-05: 09:00:00 75 mL via INTRAVENOUS
  Filled 2018-09-05: qty 75

## 2018-09-05 MED ORDER — LIDOCAINE VISCOUS HCL 2 % MT SOLN
15.0000 mL | Freq: Once | OROMUCOSAL | Status: AC
  Administered 2018-09-05: 11:00:00 15 mL via ORAL
  Filled 2018-09-05: qty 15

## 2018-09-05 NOTE — ED Provider Notes (Signed)
[facility] Emergency Department Provider Note       Time seen: ----------------------------------------- 7:46 AM on 09/05/2018 -----------------------------------------   I have reviewed the triage vital signs and the nursing notes.  HISTORY   Chief Complaint No chief complaint on file.    HPI Jamie Todd is a 78 y.o. female with a history of chronic migraines, hyperlipidemia, ulcerative colitis who presents to the ED for chest pain and pleuritic pain that began last night.  Patient states she has had a low-grade fever but she is not sure what her temperature was.  She has had some rusty colored sputum.  She has chest pain and also back pain again that is worse with breathing.  She also has a sore throat and feels like there is a golf ball in her throat.  She denies other GI complaints.  Past Medical History:  Diagnosis Date  . Cat scratch fever   . Chronic fatigue   . Chronic migraine   . Chronic sinusitis   . Complication of anesthesia    slow to wake up  . Depression   . Endometriosis   . GERD (gastroesophageal reflux disease)   . Hemochromatosis   . Hyperlipidemia   . Measles   . Miscarriage   . Osteoporosis   . Prediabetes   . Pyloric stenosis   . Skin cancer    basal and squamous cell   . Type A blood, Rh negative   . Ulcerative colitis ([place])   . UTI (urinary tract infection)     Patient Active Problem List   Diagnosis Date Noted  . HLD (hyperlipidemia) 08/09/2018  . Hypercalcemia 08/09/2018  . Elevated BUN 08/09/2018  . Osteopenia 08/09/2018  . Allergic rhinitis 08/09/2018  . Chronic ulcerative colitis, unspecified complication ([place])   . Idiopathic chronic inflammatory bowel disease   . Chronic sinusitis 05/31/2017  . Prediabetes 05/31/2017  . Chronic fatigue 05/31/2017  . Hemochromatosis 05/31/2017  . Breast pain, right 05/31/2017  . H/O nonmelanoma skin cancer 05/31/2017  . Osteoporosis 05/31/2017    Past Surgical  History:  Procedure Laterality Date  . CATARACT EXTRACTION     b/l  . COLONOSCOPY WITH PROPOFOL N/A 08/25/2017   Procedure: COLONOSCOPY WITH PROPOFOL;  Surgeon: Lucilla Lame, MD;  Location: [facility];  Service: Endoscopy;  Laterality: N/A;  wants to be last patient  . OTHER SURGICAL HISTORY     laparoscopy several, D&Cs 1970s/1980s     Allergies Patient has no known allergies.  Social History Social History   Tobacco Use  . Smoking status: Never Smoker  . Smokeless tobacco: Never Used  Substance Use Topics  . Alcohol use: Yes    Comment: ocass  . Drug use: No    Review of Systems Constitutional: Positive for fever Cardiovascular: Positive for chest pain Respiratory: Positive for cough Gastrointestinal: Negative for abdominal pain, vomiting and diarrhea. Musculoskeletal: Positive for back pain Skin: Negative for rash. Neurological: Positive for headache  All systems negative/normal/unremarkable except as stated in the HPI  ____________________________________________   PHYSICAL EXAM:  VITAL SIGNS: ED Triage Vitals [09/05/18 0745]  Enc Vitals Group     BP      Pulse      Resp      Temp 97.8 F (36.6 C)     Temp Source Oral     SpO2      Weight      Height      Head Circumference  Peak Flow      Pain Score      Pain Loc      Pain Edu?      Excl. in [place]?    Constitutional: Alert and oriented.  Mild distress from pain Eyes: Conjunctivae are normal. Normal extraocular movements. ENT      Head: Normocephalic and atraumatic.      Nose: No congestion/rhinnorhea.      Mouth/Throat: Mucous membranes are moist.      Neck: No stridor. Cardiovascular: Normal rate, regular rhythm. No murmurs, rubs, or gallops. Respiratory: Normal respiratory effort without tachypnea nor retractions. Breath sounds are clear and equal bilaterally. No wheezes/rales/rhonchi. Gastrointestinal: Soft and nontender. Normal bowel sounds Musculoskeletal: Nontender with normal  range of motion in extremities. No lower extremity tenderness nor edema. Neurologic:  Normal speech and language. No gross focal neurologic deficits are appreciated.  Skin:  Skin is warm, dry and intact. No rash noted. Psychiatric: Mood and affect are normal. Speech and behavior are normal.  ____________________________________________  EKG: Interpreted by me.  Sinus rhythm with sinus arrhythmia, rate is 81 bpm, PVC, normal axis.  ____________________________________________  ED COURSE:  As part of my medical decision making, I reviewed the following data within the [place] History obtained from family if available, nursing notes, old chart and ekg, as well as notes from prior ED visits. Patient presented for chest and back pain, we will assess with labs and imaging as indicated at this time.   Procedures  [name] was evaluated in Emergency Department on 09/05/2018 for the symptoms described in the history of present illness. She was evaluated in the context of the global COVID-19 pandemic, which necessitated consideration that the patient might be at risk for infection with the SARS-CoV-2 virus that causes COVID-19. Institutional protocols and algorithms that pertain to the evaluation of patients at risk for COVID-19 are in a state of rapid change based on information released by regulatory bodies including the CDC and federal and state organizations. These policies and algorithms were followed during the patient's care in the ED.  ____________________________________________   LABS (pertinent positives/negatives)  Labs Reviewed  BASIC METABOLIC PANEL - Abnormal; Notable for the following components:      Result Value   Sodium 132 (*)    Chloride 92 (*)    Glucose, Bld 150 (*)    BUN 24 (*)    GFR calc non Af Amer 58 (*)    Anion gap 16 (*)    All other components within normal limits  CBC - Abnormal; Notable for the following components:   WBC 18.9 (*)     RBC 5.12 (*)    Hemoglobin 15.2 (*)    All other components within normal limits  FIBRIN DERIVATIVES D-DIMER ([facility] ONLY) - Abnormal; Notable for the following components:   Fibrin derivatives D-dimer [facility]) 1,286.68 (*)    All other components within normal limits  SARS CORONAVIRUS 2 (HOSPITAL ORDER, [facility])  TROPONIN I    RADIOLOGY Images were viewed by me  Chest x-ray/CTA chest IMPRESSION: CT negative for pulmonary emboli.  The length of the thoracic esophagus is circumferentially thickened. Correlation with symptoms of dysphagia/GERD recommended, as well as referral for GI evaluation and possible upper endoscopy, as malignancy cannot be excluded. ____________________________________________   DIFFERENTIAL DIAGNOSIS   Musculoskeletal pain, PE, pneumonia, influenza, coronavirus, migraine, anxiety  FINAL ASSESSMENT AND PLAN  Chest pain, esophagitis, stress, migraine   Plan: The patient  had presented for chest and back pain. Patient's labs did reveal leukocytosis which appears to be a stress response as she has no signs of active infection and currently feels better. Patient's imaging revealed some esophageal thickening and she does have a history of this but no longer takes antacids.  She currently feels better, she will be discharged with medications for GERD and will be advised to follow-up with her gastroenterologist.   Laurence Aly, MD    Note: This note was generated in part or whole with voice recognition software. Voice recognition is usually quite accurate but there are transcription errors that can and very often do occur. I apologize for any typographical errors that were not detected and corrected.     Earleen Newport, MD 09/05/18 1055

## 2018-09-05 NOTE — ED Notes (Signed)
E-signature pad frooze in room. Pt verbalized understanding on DC instructions. Ambulated to lobby without difficulty.

## 2018-09-05 NOTE — Telephone Encounter (Signed)
FYI

## 2018-09-05 NOTE — ED Triage Notes (Addendum)
Pt states pain when taking a breath that began last night, states low grade fever but doesn't know what temp was, cough (productive "rust color"), with CP and back pain and sore throat. Also c/o migraine with nausea x few days. States "it's normal for me to have nausea and a fever with my migraine," A&O, ambulatory. No distress noted. Mask in place.

## 2018-09-05 NOTE — ED Notes (Signed)
Pt ambulatory to toilet with steady gait noted.  

## 2018-09-05 NOTE — Telephone Encounter (Signed)
Copied from [place] [id number]. Topic: General - Other >> Sep 05, 2018  9:04 AM Valla Leaver wrote: Reason for CRM: cancel labs in ED

## 2018-09-05 NOTE — ED Notes (Signed)
Pt taken to CT.

## 2018-09-05 NOTE — ED Notes (Signed)
EDP at bedside  

## 2018-09-05 NOTE — ED Notes (Signed)
xray at bedside.

## 2018-09-08 ENCOUNTER — Other Ambulatory Visit: Payer: Self-pay | Admitting: Internal Medicine

## 2018-09-08 ENCOUNTER — Telehealth: Payer: Self-pay | Admitting: Internal Medicine

## 2018-09-08 DIAGNOSIS — R799 Abnormal finding of blood chemistry, unspecified: Secondary | ICD-10-CM

## 2018-09-08 DIAGNOSIS — D72829 Elevated white blood cell count, unspecified: Secondary | ICD-10-CM

## 2018-09-08 NOTE — Telephone Encounter (Signed)
Call pt she needs referral to GI Dr. Allen Norris for upper endoscopy bsed on CT of her chest  -does she want referral now?   Lets repeat CBC and calcium in the next 3 months lab orders in come in with a mask  Schedule lab visit   Thanks [place]

## 2018-09-11 NOTE — Telephone Encounter (Signed)
[facility] with referral and labs have been scheduled

## 2018-09-11 NOTE — Telephone Encounter (Signed)
appt 09/12/2018 Dr. Allen Norris   [place]

## 2018-09-12 ENCOUNTER — Other Ambulatory Visit: Payer: Self-pay

## 2018-09-12 ENCOUNTER — Encounter: Payer: Self-pay | Admitting: Gastroenterology

## 2018-09-12 ENCOUNTER — Ambulatory Visit (INDEPENDENT_AMBULATORY_CARE_PROVIDER_SITE_OTHER): Payer: Medicare Other | Admitting: Gastroenterology

## 2018-09-12 DIAGNOSIS — R1310 Dysphagia, unspecified: Secondary | ICD-10-CM | POA: Diagnosis not present

## 2018-09-12 NOTE — Progress Notes (Signed)
Jamie Lame, MD [address]  [place]  [place], [place] [address]  Main: [phone]  Fax: [phone]    Gastroenterology telephone Visit  Referring Provider:     McLean-Scocuzza, Olivia Mackie * Primary Care Physician:  McLean-Scocuzza, Nino Glow, MD Primary Gastroenterologist:  Dr.Bentlee [place] Allen Norris Reason for Consultation:     Dysphagia        HPI:    Virtual Visit via Video Note Location of the patient: Home Location of provider: Office  Participating persons: The patient myself and Jamie Todd.  I connected with Jamie Todd on 09/12/18 at 12:00 PM EDT by a telephone enabled telemedicine application and verified that I am speaking with the correct person using two identifiers.   I discussed the limitations of evaluation and management by telemedicine and the availability of in person appointments. The patient expressed understanding and agreed to proceed.  Verbal consent to proceed obtained.  History of Present Illness: Jamie Todd is a 78 y.o. female referred by Dr. Terese Door, Nino Glow, MD  For follow-up after being in the ED in April of this year.  The patient was seen in the ED on April 28 for chest pain.  The patient has a history of chronic migraines, hyperlipidemia, ulcerative colitis with her most recent colonoscopy in April of last year.  At that time there was significant scarring but no sign of acute inflammation and there was diverticulosis seen.  The ER doc had reported the pain to be pleuritic and at the time the patient had endorsed a low-grade fever but had not checked her temperature.  She was reporting a sore throat and a feeling like a golf ball was in her throat.  She was discharged with being treated for GERD and was told to follow-up with me. It started with burning in the throat and when she was under a lot of stress with a migraine at the same time.  She has been using cetocaine spray with relief of her symptoms. She was having pain when she  took a big breath and that has resolved.  Past Medical History:  Diagnosis Date   Cat scratch fever    Chronic fatigue    Chronic migraine    Chronic sinusitis    Complication of anesthesia    slow to wake up   Depression    Endometriosis    GERD (gastroesophageal reflux disease)    Hemochromatosis    Hyperlipidemia    Measles    Miscarriage    Osteoporosis    Prediabetes    Pyloric stenosis    Skin cancer    basal and squamous cell    Type A blood, Rh negative    Ulcerative colitis ([place])    UTI (urinary tract infection)     Past Surgical History:  Procedure Laterality Date   CATARACT EXTRACTION     b/l   COLONOSCOPY WITH PROPOFOL N/A 08/25/2017   Procedure: COLONOSCOPY WITH PROPOFOL;  Surgeon: Jamie Lame, MD;  Location: [facility];  Service: Endoscopy;  Laterality: N/A;  wants to be last patient   OTHER SURGICAL HISTORY     laparoscopy several, D&Cs 1970s/1980s     Prior to Admission medications   Medication Sig Start Date End Date Taking? Authorizing Provider  acetaminophen (TYLENOL) 325 MG tablet Take 650 mg by mouth every 6 (six) hours as needed.    [provider]  acidophilus (RISAQUAD) CAPS capsule Take by mouth daily.    [provider]  aspirin-acetaminophen-caffeine (EXCEDRIN MIGRAINE) 250-250-65 MG tablet Take by mouth every 6 (six) hours as needed for headache.    [provider]  butalbital-acetaminophen-caffeine (FIORICET) 50-325-40 MG tablet Take 1-2 tablets by mouth every 6 (six) hours as needed. 09/05/18 09/05/19  Earleen Newport, MD  Calcium-Magnesium-Vitamin D (CALCIUM 500 PO) Take by mouth.    [provider]  cetirizine-pseudoephedrine (ZYRTEC-D) 5-120 MG tablet Take 1 tablet by mouth daily.    [provider]  Cranberry 1000 MG CAPS Take by mouth.    [provider]  dextromethorphan-guaiFENesin (MUCINEX DM) 30-600 MG 12hr tablet Take 1 tablet by mouth 2 (two)  times daily.    [provider]  fluticasone Asencion Islam) 50 MCG/ACT nasal spray  04/15/17   [provider]  ibuprofen (ADVIL,MOTRIN) 200 MG tablet Take 200 mg by mouth every 6 (six) hours as needed.    [provider]  MELATONIN PO Take 6 mg by mouth.    [provider]  montelukast (SINGULAIR) 10 MG tablet  03/07/17   [provider]  Multiple Vitamin (MULTIVITAMIN) tablet Take 1 tablet by mouth daily.    [provider]  Multiple Vitamins-Minerals (ALIVE WOMENS ENERGY PO) Take by mouth.    [provider]  Multiple Vitamins-Minerals (COMPLETE ENERGY PO) Take by mouth.    [provider]  pantoprazole (PROTONIX) 40 MG tablet Take 1 tablet (40 mg total) by mouth daily. 09/05/18 09/05/19  Earleen Newport, MD    Family History  Problem Relation Age of Onset   Dementia Mother    Diabetes Other        both sides of family      Social History   Tobacco Use   Smoking status: Never Smoker   Smokeless tobacco: Never Used  Substance Use Topics   Alcohol use: Yes    Comment: ocass   Drug use: No    Allergies as of 09/12/2018   (No Known Allergies)    Review of Systems:    All systems reviewed and negative except where noted in HPI.   Observations/Objective:  Labs: CBC    Component Value Date/Time   WBC 18.9 (H) 09/05/2018 0756   RBC 5.12 (H) 09/05/2018 0756   HGB 15.2 (H) 09/05/2018 0756   HCT 45.3 09/05/2018 0756   PLT 357 09/05/2018 0756   MCV 88.5 09/05/2018 0756   MCH 29.7 09/05/2018 0756   MCHC 33.6 09/05/2018 0756   RDW 12.7 09/05/2018 0756   LYMPHSABS 1.8 07/14/2018 1028   MONOABS 0.4 07/14/2018 1028   EOSABS 0.1 07/14/2018 1028   BASOSABS 0.0 07/14/2018 1028   CMP     Component Value Date/Time   NA 132 (L) 09/05/2018 0756   K 3.6 09/05/2018 0756   CL 92 (L) 09/05/2018 0756   CO2 24 09/05/2018 0756   GLUCOSE 150 (H) 09/05/2018 0756   BUN 24 (H) 09/05/2018 0756   CREATININE  0.96 09/05/2018 0756   CALCIUM 10.0 09/05/2018 0756   PROT 8.2 07/14/2018 1028   PROT 7.8 07/25/2017 1446   ALBUMIN 4.7 07/14/2018 1028   ALBUMIN 4.7 07/25/2017 1446   AST 27 07/14/2018 1028   ALT 29 07/14/2018 1028   ALKPHOS 104 07/14/2018 1028   BILITOT 0.4 07/14/2018 1028   BILITOT <0.2 07/25/2017 1446   GFRNONAA 58 (L) 09/05/2018 0756   GFRAA >60 09/05/2018 0756    Imaging Studies: Ct Angio Chest Pe W And/or Wo Contrast  Result Date: 09/05/2018 CLINICAL DATA:  78 year old  female with low-grade temperature and cough EXAM: CT ANGIOGRAPHY CHEST WITH CONTRAST TECHNIQUE: Multidetector CT imaging of the chest was performed using the standard protocol during bolus administration of intravenous contrast. Multiplanar CT image reconstructions and MIPs were obtained to evaluate the vascular anatomy. CONTRAST:  69m OMNIPAQUE IOHEXOL 350 MG/ML SOLN COMPARISON:  None. FINDINGS: Cardiovascular: Heart: No cardiomegaly. No pericardial fluid/thickening. No significant coronary calcifications. Aorta: Unremarkable course, caliber, contour of the thoracic aorta. No aneurysm or dissection flap. No periaortic fluid. Pulmonary arteries: No central, lobar, segmental, or proximal subsegmental filling defects. Mediastinum/Nodes: The length of the thoracic esophagus is circumferentially thickened, with poor definition of the tissue/fat plane. No adenopathy. Unremarkable thoracic inlet. Lungs/Pleura: Respiratory motion. No pleural effusion or pneumothorax. Atelectasis at the lung bases. No edema. No endobronchial debris or bronchial wall thickening. Upper Abdomen: Stomach distension, incompletely imaged. Musculoskeletal: No acute displaced fracture. Degenerative changes of the spine. Review of the MIP images confirms the above findings. IMPRESSION: CT negative for pulmonary emboli. The length of the thoracic esophagus is circumferentially thickened. Correlation with symptoms of dysphagia/GERD recommended, as well as  referral for GI evaluation and possible upper endoscopy, as malignancy cannot be excluded. Electronically Signed   By: JCorrie MckusickD.O.   On: 09/05/2018 09:49   Dg Chest Port 1 View  Result Date: 09/05/2018 CLINICAL DATA:  Chest pain and fever EXAM: PORTABLE CHEST 1 VIEW COMPARISON:  None. FINDINGS: There is atelectatic change in the left base. The lungs elsewhere are clear. Heart size and pulmonary vascularity are normal. No adenopathy. No pneumothorax. No bone lesions. IMPRESSION: Left base atelectasis.  Lungs elsewhere clear. Electronically Signed   By: WLowella GripIII M.D.   On: 09/05/2018 08:28    Assessment and Plan:   KTIFFANY CALMESis a 78y.o. y/o female has been referred for dysphagia and burning in throat. These are worse with stress and with when she gets her migraines. The patient is doing better now. The patient reports that she is concerned about the dysphagia. The patient will be set up for an EGD when the CSusan Moorepandemic has improved.    Follow Up Instructions:  I discussed the assessment and treatment plan with the patient. The patient was provided an opportunity to ask questions and all were answered. The patient agreed with the plan and demonstrated an understanding of the instructions.   The patient was advised to call back or seek an in-person evaluation if the symptoms worsen or if the condition fails to improve as anticipated.  I provided 18 minutes of non-face-to-face time during this encounter.   DLucilla Lame MD  Speech recognition software was used to dictate the above note.

## 2018-10-05 ENCOUNTER — Other Ambulatory Visit: Payer: Self-pay

## 2018-10-05 DIAGNOSIS — R1310 Dysphagia, unspecified: Secondary | ICD-10-CM

## 2018-10-13 ENCOUNTER — Other Ambulatory Visit: Admission: RE | Admit: 2018-10-13 | Payer: Medicare Other | Source: Ambulatory Visit

## 2018-10-16 ENCOUNTER — Other Ambulatory Visit: Admission: RE | Admit: 2018-10-16 | Payer: Medicare Other | Source: Ambulatory Visit

## 2018-10-17 ENCOUNTER — Ambulatory Visit: Admit: 2018-10-17 | Payer: Medicare Other | Admitting: Gastroenterology

## 2018-10-17 SURGERY — ESOPHAGOGASTRODUODENOSCOPY (EGD) WITH PROPOFOL
Anesthesia: General

## 2018-12-12 ENCOUNTER — Other Ambulatory Visit: Payer: Self-pay

## 2018-12-12 ENCOUNTER — Other Ambulatory Visit (INDEPENDENT_AMBULATORY_CARE_PROVIDER_SITE_OTHER): Payer: Medicare Other

## 2018-12-12 ENCOUNTER — Encounter (INDEPENDENT_AMBULATORY_CARE_PROVIDER_SITE_OTHER): Payer: Self-pay

## 2018-12-12 DIAGNOSIS — D72829 Elevated white blood cell count, unspecified: Secondary | ICD-10-CM

## 2018-12-12 DIAGNOSIS — Z13818 Encounter for screening for other digestive system disorders: Secondary | ICD-10-CM

## 2018-12-12 DIAGNOSIS — Z1159 Encounter for screening for other viral diseases: Secondary | ICD-10-CM

## 2018-12-12 DIAGNOSIS — R799 Abnormal finding of blood chemistry, unspecified: Secondary | ICD-10-CM | POA: Diagnosis not present

## 2018-12-12 LAB — CBC WITH DIFFERENTIAL/PLATELET
Basophils Absolute: 0 10*3/uL (ref 0.0–0.1)
Basophils Relative: 0.4 % (ref 0.0–3.0)
Eosinophils Absolute: 0.1 10*3/uL (ref 0.0–0.7)
Eosinophils Relative: 1.6 % (ref 0.0–5.0)
HCT: 39.1 % (ref 36.0–46.0)
Hemoglobin: 13.1 g/dL (ref 12.0–15.0)
Lymphocytes Relative: 29 % (ref 12.0–46.0)
Lymphs Abs: 1.9 10*3/uL (ref 0.7–4.0)
MCHC: 33.4 g/dL (ref 30.0–36.0)
MCV: 90.1 fl (ref 78.0–100.0)
Monocytes Absolute: 0.4 10*3/uL (ref 0.1–1.0)
Monocytes Relative: 6.5 % (ref 3.0–12.0)
Neutro Abs: 4.2 10*3/uL (ref 1.4–7.7)
Neutrophils Relative %: 62.5 % (ref 43.0–77.0)
Platelets: 301 10*3/uL (ref 150.0–400.0)
RBC: 4.34 Mil/uL (ref 3.87–5.11)
RDW: 14.1 % (ref 11.5–15.5)
WBC: 6.7 10*3/uL (ref 4.0–10.5)

## 2018-12-12 LAB — COMPREHENSIVE METABOLIC PANEL
ALT: 26 U/L (ref 0–35)
AST: 24 U/L (ref 0–37)
Albumin: 4.4 g/dL (ref 3.5–5.2)
Alkaline Phosphatase: 117 U/L (ref 39–117)
BUN: 23 mg/dL (ref 6–23)
CO2: 26 mEq/L (ref 19–32)
Calcium: 10.1 mg/dL (ref 8.4–10.5)
Chloride: 101 mEq/L (ref 96–112)
Creatinine, Ser: 0.81 mg/dL (ref 0.40–1.20)
GFR: 68.8 mL/min (ref >60.00)
Glucose, Bld: 110 mg/dL — ABNORMAL HIGH (ref 70–99)
Potassium: 4.3 mEq/L (ref 3.5–5.1)
Sodium: 136 mEq/L (ref 135–145)
Total Bilirubin: 0.3 mg/dL (ref 0.2–1.2)
Total Protein: 7.3 g/dL (ref 6.0–8.3)

## 2018-12-12 NOTE — Addendum Note (Signed)
Addended by: Leeanne Rio on: 12/12/2018 02:24 PM   Modules accepted: Orders

## 2018-12-13 LAB — HEPATITIS C ANTIBODY
Hepatitis C Ab: NONREACTIVE
SIGNAL TO CUT-OFF: 0 (ref <1.00)

## 2018-12-13 LAB — SODIUM, URINE, RANDOM: Sodium, Ur: 31 mmol/L (ref 28–272)

## 2018-12-13 LAB — MICROALBUMIN / CREATININE URINE RATIO
Creatinine, Urine: 88 mg/dL (ref 20–275)
Microalb Creat Ratio: 7 mcg/mg creat (ref <30)
Microalb, Ur: 0.6 mg/dL

## 2018-12-13 LAB — HEPATITIS B SURFACE ANTIBODY, QUANTITATIVE: Hepatitis B-Post: <5 m[IU]/mL — ABNORMAL LOW (ref >=10)

## 2018-12-13 LAB — HEPATITIS B SURFACE ANTIGEN: Hepatitis B Surface Ag: NONREACTIVE

## 2018-12-19 ENCOUNTER — Telehealth: Payer: Self-pay | Admitting: Internal Medicine

## 2018-12-19 NOTE — Telephone Encounter (Signed)
Pt dropped off a blue envelope that has information on writing a letter for pt. Envelope is up front in Dr. Audrie Gallus color folder.

## 2018-12-25 NOTE — Telephone Encounter (Signed)
Patient checking on the status of forms mentioned below, due date as soon as possible,  please advise

## 2018-12-25 NOTE — Telephone Encounter (Signed)
We will let her know when they are done I have a lot of paperwork for patients and typically takes 1-2 weeks depending on what else I have going on  Will call her when ready   [place]

## 2018-12-26 ENCOUNTER — Encounter: Payer: Self-pay | Admitting: Internal Medicine

## 2018-12-26 ENCOUNTER — Telehealth: Payer: Self-pay | Admitting: Internal Medicine

## 2018-12-26 DIAGNOSIS — Z0279 Encounter for issue of other medical certificate: Secondary | ICD-10-CM

## 2018-12-26 NOTE — Telephone Encounter (Signed)
Call patient letter is ready to pick up and there is a fee   Jamie Todd

## 2018-12-26 NOTE — Telephone Encounter (Signed)
Patient notified and voiced understanding.

## 2018-12-27 NOTE — Telephone Encounter (Signed)
Called patient. No answer.  Unable to leave a message.

## 2018-12-28 ENCOUNTER — Other Ambulatory Visit: Payer: Self-pay

## 2018-12-28 ENCOUNTER — Ambulatory Visit (INDEPENDENT_AMBULATORY_CARE_PROVIDER_SITE_OTHER): Payer: Medicare Other

## 2018-12-28 DIAGNOSIS — Z23 Encounter for immunization: Secondary | ICD-10-CM

## 2018-12-28 NOTE — Telephone Encounter (Signed)
Spoke w/ pt today at her nurse visit.  Patient aware that letter is at the front desk ready for pick up.

## 2019-01-25 ENCOUNTER — Telehealth: Payer: Self-pay

## 2019-01-25 NOTE — Telephone Encounter (Signed)
Copied from [place] [id number]. Topic: General - Other >> Jan 25, 2019 12:14 PM Celene Kras A wrote: Reason for CRM: Pt called stating she is needing her Hep B injection by 01/26/2019. Pt is requesting to have it done today or tomorrow. Pt is requesting to be "fit in". Please advise.

## 2019-01-26 NOTE — Telephone Encounter (Signed)
Can you please schedule her a nurse visit?

## 2019-03-13 ENCOUNTER — Ambulatory Visit: Payer: Medicare Other | Admitting: Internal Medicine

## 2019-06-08 ENCOUNTER — Other Ambulatory Visit: Payer: Self-pay

## 2019-06-08 ENCOUNTER — Ambulatory Visit (INDEPENDENT_AMBULATORY_CARE_PROVIDER_SITE_OTHER): Payer: Medicare Other | Admitting: Internal Medicine

## 2019-06-08 ENCOUNTER — Encounter: Payer: Self-pay | Admitting: Internal Medicine

## 2019-06-08 VITALS — Ht 60.0 in | Wt 125.0 lb

## 2019-06-08 DIAGNOSIS — Z0000 Encounter for general adult medical examination without abnormal findings: Secondary | ICD-10-CM

## 2019-06-08 DIAGNOSIS — M8580 Other specified disorders of bone density and structure, unspecified site: Secondary | ICD-10-CM

## 2019-06-08 DIAGNOSIS — R7303 Prediabetes: Secondary | ICD-10-CM

## 2019-06-08 DIAGNOSIS — Z1389 Encounter for screening for other disorder: Secondary | ICD-10-CM

## 2019-06-08 DIAGNOSIS — R1310 Dysphagia, unspecified: Secondary | ICD-10-CM

## 2019-06-08 DIAGNOSIS — Z13220 Encounter for screening for lipoid disorders: Secondary | ICD-10-CM

## 2019-06-08 DIAGNOSIS — Z1329 Encounter for screening for other suspected endocrine disorder: Secondary | ICD-10-CM

## 2019-06-08 DIAGNOSIS — Z1231 Encounter for screening mammogram for malignant neoplasm of breast: Secondary | ICD-10-CM

## 2019-06-08 NOTE — Patient Instructions (Addendum)
Prediabetes Eating Plan Prediabetes is a condition that causes blood sugar (glucose) levels to be higher than normal. This increases the risk for developing diabetes. In order to prevent diabetes from developing, your health care provider may recommend a diet and other lifestyle changes to help you:  Control your blood glucose levels.  Improve your cholesterol levels.  Manage your blood pressure. Your health care provider may recommend working with a diet and nutrition specialist (dietitian) to make a meal plan that is best for you. What are tips for following this plan? Lifestyle  Set weight loss goals with the help of your health care team. It is recommended that most people with prediabetes lose 7% of their current body weight.  Exercise for at least 30 minutes at least 5 days a week.  Attend a support group or seek ongoing support from a mental health counselor.  Take over-the-counter and prescription medicines only as told by your health care provider. Reading food labels  Read food labels to check the amount of fat, salt (sodium), and sugar in prepackaged foods. Avoid foods that have: ? Saturated fats. ? Trans fats. ? Added sugars.  Avoid foods that have more than 300 milligrams (mg) of sodium per serving. Limit your daily sodium intake to less than 2,300 mg each day. Shopping  Avoid buying pre-made and processed foods. Cooking  Cook with olive oil. Do not use butter, lard, or ghee.  Bake, broil, grill, or boil foods. Avoid frying. Meal planning   Work with your dietitian to develop an eating plan that is right for you. This may include: ? Tracking how many calories you take in. Use a food diary, notebook, or mobile application to track what you eat at each meal. ? Using the glycemic index (GI) to plan your meals. The index tells you how quickly a food will raise your blood glucose. Choose low-GI foods. These foods take a longer time to raise blood glucose.  Consider  following a Mediterranean diet. This diet includes: ? Several servings each day of fresh fruits and vegetables. ? Eating fish at least twice a week. ? Several servings each day of whole grains, beans, nuts, and seeds. ? Using olive oil instead of other fats. ? Moderate alcohol consumption. ? Eating small amounts of red meat and whole-fat dairy.  If you have high blood pressure, you may need to limit your sodium intake or follow a diet such as the DASH eating plan. DASH is an eating plan that aims to lower high blood pressure. What foods are recommended? The items listed below may not be a complete list. Talk with your dietitian about what dietary choices are best for you. Grains Whole grains, such as whole-wheat or whole-grain breads, crackers, cereals, and pasta. Unsweetened oatmeal. Bulgur. Barley. Quinoa. Brown rice. Corn or whole-wheat flour tortillas or taco shells. Vegetables Lettuce. Spinach. Peas. Beets. Cauliflower. Cabbage. Broccoli. Carrots. Tomatoes. Squash. Eggplant. Herbs. Peppers. Onions. Cucumbers. Brussels sprouts. Fruits Berries. Bananas. Apples. Oranges. Grapes. Papaya. Mango. Pomegranate. Kiwi. Grapefruit. Cherries. Meats and other protein foods Seafood. Poultry without skin. Lean cuts of pork and beef. Tofu. Eggs. Nuts. Beans. Dairy Low-fat or fat-free dairy products, such as yogurt, cottage cheese, and cheese. Beverages Water. Tea. Coffee. Sugar-free or diet soda. Seltzer water. Lowfat or no-fat milk. Milk alternatives, such as soy or almond milk. Fats and oils Olive oil. Canola oil. Sunflower oil. Grapeseed oil. Avocado. Walnuts. Sweets and desserts Sugar-free or low-fat pudding. Sugar-free or low-fat ice cream and other frozen treats.  Seasoning and other foods Herbs. Sodium-free spices. Mustard. Relish. Low-fat, low-sugar ketchup. Low-fat, low-sugar barbecue sauce. Low-fat or fat-free mayonnaise. What foods are not recommended? The items listed below may not be a  complete list. Talk with your dietitian about what dietary choices are best for you. Grains Refined white flour and flour products, such as bread, pasta, snack foods, and cereals. Vegetables Canned vegetables. Frozen vegetables with butter or cream sauce. Fruits Fruits canned with syrup. Meats and other protein foods Fatty cuts of meat. Poultry with skin. Breaded or fried meat. Processed meats. Dairy Full-fat yogurt, cheese, or milk. Beverages Sweetened drinks, such as sweet iced tea and soda. Fats and oils Butter. Lard. Ghee. Sweets and desserts Baked goods, such as cake, cupcakes, pastries, cookies, and cheesecake. Seasoning and other foods Spice mixes with added salt. Ketchup. Barbecue sauce. Mayonnaise. Summary  To prevent diabetes from developing, you may need to make diet and other lifestyle changes to help control blood sugar, improve cholesterol levels, and manage your blood pressure.  Set weight loss goals with the help of your health care team. It is recommended that most people with prediabetes lose 7 percent of their current body weight.  Consider following a Mediterranean diet that includes plenty of fresh fruits and vegetables, whole grains, beans, nuts, seeds, fish, lean meat, low-fat dairy, and healthy oils. This information is not intended to replace advice given to you by your health care provider. Make sure you discuss any questions you have with your health care provider. Document Revised: 08/18/2018 Document Reviewed: 06/30/2016 Elsevier Patient Education  [place]. Denosumab injection What is this medicine? DENOSUMAB (den oh sue mab) slows bone breakdown. Prolia is used to treat osteoporosis in women after menopause and in men, and in people who are taking corticosteroids for 6 months or more. Delton See is used to treat a high calcium level due to cancer and to prevent bone fractures and other bone problems caused by multiple myeloma or cancer bone  metastases. Delton See is also used to treat giant cell tumor of the bone. This medicine may be used for other purposes; ask your health care provider or pharmacist if you have questions. COMMON BRAND NAME(S): Prolia, XGEVA What should I tell my health care provider before I take this medicine? They need to know if you have any of these conditions:  dental disease  having surgery or tooth extraction  infection  kidney disease  low levels of calcium or Vitamin D in the blood  malnutrition  on hemodialysis  skin conditions or sensitivity  thyroid or parathyroid disease  an unusual reaction to denosumab, other medicines, foods, dyes, or preservatives  pregnant or trying to get pregnant  breast-feeding How should I use this medicine? This medicine is for injection under the skin. It is given by a health care professional in a hospital or clinic setting. A special MedGuide will be given to you before each treatment. Be sure to read this information carefully each time. For Prolia, talk to your pediatrician regarding the use of this medicine in children. Special care may be needed. For Delton See, talk to your pediatrician regarding the use of this medicine in children. While this drug may be prescribed for children as young as 13 years for selected conditions, precautions do apply. Overdosage: If you think you have taken too much of this medicine contact a poison control center or emergency room at once. NOTE: This medicine is only for you. Do not share this medicine with others. What if  I miss a dose? It is important not to miss your dose. Call your doctor or health care professional if you are unable to keep an appointment. What may interact with this medicine? Do not take this medicine with any of the following medications:  other medicines containing denosumab This medicine may also interact with the following medications:  medicines that lower your chance of fighting  infection  steroid medicines like prednisone or cortisone This list may not describe all possible interactions. Give your health care provider a list of all the medicines, herbs, non-prescription drugs, or dietary supplements you use. Also tell them if you smoke, drink alcohol, or use illegal drugs. Some items may interact with your medicine. What should I watch for while using this medicine? Visit your doctor or health care professional for regular checks on your progress. Your doctor or health care professional may order blood tests and other tests to see how you are doing. Call your doctor or health care professional for advice if you get a fever, chills or sore throat, or other symptoms of a cold or flu. Do not treat yourself. This drug may decrease your body's ability to fight infection. Try to avoid being around people who are sick. You should make sure you get enough calcium and vitamin D while you are taking this medicine, unless your doctor tells you not to. Discuss the foods you eat and the vitamins you take with your health care professional. See your dentist regularly. Brush and floss your teeth as directed. Before you have any dental work done, tell your dentist you are receiving this medicine. Do not become pregnant while taking this medicine or for 5 months after stopping it. Talk with your doctor or health care professional about your birth control options while taking this medicine. Women should inform their doctor if they wish to become pregnant or think they might be pregnant. There is a potential for serious side effects to an unborn child. Talk to your health care professional or pharmacist for more information. What side effects may I notice from receiving this medicine? Side effects that you should report to your doctor or health care professional as soon as possible:  allergic reactions like skin rash, itching or hives, swelling of the face, lips, or tongue  bone  pain  breathing problems  dizziness  jaw pain, especially after dental work  redness, blistering, peeling of the skin  signs and symptoms of infection like fever or chills; cough; sore throat; pain or trouble passing urine  signs of low calcium like fast heartbeat, muscle cramps or muscle pain; pain, tingling, numbness in the hands or feet; seizures  unusual bleeding or bruising  unusually weak or tired Side effects that usually do not require medical attention (report to your doctor or health care professional if they continue or are bothersome):  constipation  diarrhea  headache  joint pain  loss of appetite  muscle pain  runny nose  tiredness  upset stomach This list may not describe all possible side effects. Call your doctor for medical advice about side effects. You may report side effects to FDA at 1-800-FDA-1088. Where should I keep my medicine? This medicine is only given in a clinic, doctor's office, or other health care setting and will not be stored at home. NOTE: This sheet is a summary. It may not cover all possible information. If you have questions about this medicine, talk to your doctor, pharmacist, or health care provider.  2020 Elsevier/Gold Standard (2017-09-02  16:10:44)  Cholesterol Content in Foods Cholesterol is a waxy, fat-like substance that helps to carry fat in the blood. The body needs cholesterol in small amounts, but too much cholesterol can cause damage to the arteries and heart. Most people should eat less than 200 milligrams (mg) of cholesterol a day. Foods with cholesterol  Cholesterol is found in animal-based foods, such as meat, seafood, and dairy. Generally, low-fat dairy and lean meats have less cholesterol than full-fat dairy and fatty meats. The milligrams of cholesterol per serving (mg per serving) of common cholesterol-containing foods are listed below. Meat and other proteins  Egg -- one large whole egg has 186 mg.  Veal  shank -- 4 oz has 141 mg.  Lean ground [place] (93% lean) -- 4 oz has 118 mg.  Fat-trimmed lamb loin -- 4 oz has 106 mg.  Lean ground beef (90% lean) -- 4 oz has 100 mg.  Lobster -- 3.5 oz has 90 mg.  Pork loin chops -- 4 oz has 86 mg.  Canned salmon -- 3.5 oz has 83 mg.  Fat-trimmed beef top loin -- 4 oz has 78 mg.  Frankfurter -- 1 frank (3.5 oz) has 77 mg.  Crab -- 3.5 oz has 71 mg.  Roasted chicken without skin, white meat -- 4 oz has 66 mg.  Light bologna -- 2 oz has 45 mg.  Deli-cut [place] -- 2 oz has 31 mg.  Canned tuna -- 3.5 oz has 31 mg.  Berniece Salines -- 1 oz has 29 mg.  Oysters and mussels (raw) -- 3.5 oz has 25 mg.  Mackerel -- 1 oz has 22 mg.  Trout -- 1 oz has 20 mg.  Pork sausage -- 1 link (1 oz) has 17 mg.  Salmon -- 1 oz has 16 mg.  Tilapia -- 1 oz has 14 mg. Dairy  Soft-serve ice cream --  cup (4 oz) has 103 mg.  Whole-milk yogurt -- 1 cup (8 oz) has 29 mg.  Cheddar cheese -- 1 oz has 28 mg.  American cheese -- 1 oz has 28 mg.  Whole milk -- 1 cup (8 oz) has 23 mg.  2% milk -- 1 cup (8 oz) has 18 mg.  Cream cheese -- 1 tablespoon (Tbsp) has 15 mg.  Cottage cheese --  cup (4 oz) has 14 mg.  Low-fat (1%) milk -- 1 cup (8 oz) has 10 mg.  Sour cream -- 1 Tbsp has 8.5 mg.  Low-fat yogurt -- 1 cup (8 oz) has 8 mg.  Nonfat Greek yogurt -- 1 cup (8 oz) has 7 mg.  Half-and-half cream -- 1 Tbsp has 5 mg. Fats and oils  Cod liver oil -- 1 tablespoon (Tbsp) has 82 mg.  Butter -- 1 Tbsp has 15 mg.  Lard -- 1 Tbsp has 14 mg.  Bacon grease -- 1 Tbsp has 14 mg.  Mayonnaise -- 1 Tbsp has 5-10 mg.  Margarine -- 1 Tbsp has 3-10 mg. Exact amounts of cholesterol in these foods may vary depending on specific ingredients and brands. Foods without cholesterol Most plant-based foods do not have cholesterol unless you combine them with a food that has cholesterol. Foods without cholesterol include:  Grains and  cereals.  Vegetables.  Fruits.  Vegetable oils, such as olive, canola, and sunflower oil.  Legumes, such as peas, beans, and lentils.  Nuts and seeds.  Egg whites. Summary  The body needs cholesterol in small amounts, but too much cholesterol can cause damage to the arteries  and heart.  Most people should eat less than 200 milligrams (mg) of cholesterol a day. This information is not intended to replace advice given to you by your health care provider. Make sure you discuss any questions you have with your health care provider. Document Revised: 04/08/2017 Document Reviewed: 12/21/2016 Elsevier Patient Education  [place].   High Cholesterol  High cholesterol is a condition in which the blood has high levels of a white, waxy, fat-like substance (cholesterol). The human body needs small amounts of cholesterol. The liver makes all the cholesterol that the body needs. Extra (excess) cholesterol comes from the food that we eat. Cholesterol is carried from the liver by the blood through the blood vessels. If you have high cholesterol, deposits (plaques) may build up on the walls of your blood vessels (arteries). Plaques make the arteries narrower and stiffer. Cholesterol plaques increase your risk for heart attack and stroke. Work with your health care provider to keep your cholesterol levels in a healthy range. What increases the risk? This condition is more likely to develop in people who:  Eat foods that are high in animal fat (saturated fat) or cholesterol.  Are overweight.  Are not getting enough exercise.  Have a family history of high cholesterol. What are the signs or symptoms? There are no symptoms of this condition. How is this diagnosed? This condition may be diagnosed from the results of a blood test.  If you are older than age 10, your health care provider may check your cholesterol every 4-6 years.  You may be checked more often if you already have high  cholesterol or other risk factors for heart disease. The blood test for cholesterol measures:  "Bad" cholesterol (LDL cholesterol). This is the main type of cholesterol that causes heart disease. The desired level for LDL is less than 100.  "Good" cholesterol (HDL cholesterol). This type helps to protect against heart disease by cleaning the arteries and carrying the LDL away. The desired level for HDL is 60 or higher.  Triglycerides. These are fats that the body can store or burn for energy. The desired number for triglycerides is lower than 150.  Total cholesterol. This is a measure of the total amount of cholesterol in your blood, including LDL cholesterol, HDL cholesterol, and triglycerides. A healthy number is less than 200. How is this treated? This condition is treated with diet changes, lifestyle changes, and medicines. Diet changes  This may include eating more whole grains, fruits, vegetables, nuts, and fish.  This may also include cutting back on red meat and foods that have a lot of added sugar. Lifestyle changes  Changes may include getting at least 40 minutes of aerobic exercise 3 times a week. Aerobic exercises include walking, biking, and swimming. Aerobic exercise along with a healthy diet can help you maintain a healthy weight.  Changes may also include quitting smoking. Medicines  Medicines are usually given if diet and lifestyle changes have failed to reduce your cholesterol to healthy levels.  Your health care provider may prescribe a statin medicine. Statin medicines have been shown to reduce cholesterol, which can reduce the risk of heart disease. Follow these instructions at home: Eating and drinking If told by your health care provider:  Eat chicken (without skin), fish, veal, shellfish, ground [place] breast, and round or loin cuts of red meat.  Do not eat fried foods or fatty meats, such as hot dogs and salami.  Eat plenty of fruits, such as apples.  Eat  plenty of vegetables, such as broccoli, potatoes, and carrots.  Eat beans, peas, and lentils.  Eat grains such as barley, rice, couscous, and bulgur wheat.  Eat pasta without cream sauces.  Use skim or nonfat milk, and eat low-fat or nonfat yogurt and cheeses.  Do not eat or drink whole milk, cream, ice cream, egg yolks, or hard cheeses.  Do not eat stick margarine or tub margarines that contain trans fats (also called partially hydrogenated oils).  Do not eat saturated tropical oils, such as coconut oil and palm oil.  Do not eat cakes, cookies, crackers, or other baked goods that contain trans fats.  General instructions  Exercise as directed by your health care provider. Increase your activity level with activities such as gardening, walking, and taking the stairs.  Take over-the-counter and prescription medicines only as told by your health care provider.  Do not use any products that contain nicotine or tobacco, such as cigarettes and e-cigarettes. If you need help quitting, ask your health care provider.  Keep all follow-up visits as told by your health care provider. This is important. Contact a health care provider if:  You are struggling to maintain a healthy diet or weight.  You need help to start on an exercise program.  You need help to stop smoking. Get help right away if:  You have chest pain.  You have trouble breathing. This information is not intended to replace advice given to you by your health care provider. Make sure you discuss any questions you have with your health care provider. Document Revised: 04/29/2017 Document Reviewed: 10/25/2015 Elsevier Patient Education  [place].

## 2019-06-08 NOTE — Progress Notes (Signed)
telephone Note  I connected with Jamie Todd  on 06/08/19 at  4:20 PM EST by a telephone and verified that I am speaking with the correct person using two identifiers.  Location patient: home Location provider:work or home office Persons participating in the virtual visit: patient, provider  I discussed the limitations of evaluation and management by telemedicine and the availability of in person appointments. The patient expressed understanding and agreed to proceed.   HPI: 1. URI resolved has seasonal allergies and tried mucinex and sx's resolved and has worsening allergies in spring and fall saw ENT 06/07/19 and appt went well  2. Prediabetes A1C 5.9 pt did not meet with nutrition due to covid 19 pandemic and is aware she needs to work on her diet  3. Osteopenia with elevated Frax score interested in prolia   ROS: See pertinent positives and negatives per HPI. General: she is up 10 lbs  CV: no chest pain  Lungs: no sob  GI: no GIB  Past Medical History:  Diagnosis Date  . Cat scratch fever   . Chronic fatigue   . Chronic migraine    as of 2020 resolved and no issues w/in the last 20 years per pt   . Chronic sinusitis   . Complication of anesthesia    slow to wake up  . Depression    1975 to 1995 professional help sought  . Endometriosis   . GERD (gastroesophageal reflux disease)   . Hemochromatosis   . Hyperlipidemia   . Measles   . Miscarriage   . Osteoporosis   . Prediabetes   . Pyloric stenosis   . Skin cancer    basal and squamous cell   . Type A blood, Rh negative   . Ulcerative colitis ([place])    1975 to 1995 tx'ed with medical care   . UTI (urinary tract infection)     Past Surgical History:  Procedure Laterality Date  . CATARACT EXTRACTION     b/l  . COLONOSCOPY WITH PROPOFOL N/A 08/25/2017   Procedure: COLONOSCOPY WITH PROPOFOL;  Surgeon: Lucilla Lame, MD;  Location: [place];  Service: Endoscopy;  Laterality: N/A;  wants to be last patient   . OTHER SURGICAL HISTORY     laparoscopy several, D&Cs 1970s/1980s     Family History  Problem Relation Age of Onset  . Dementia Mother   . Heart disease Mother   . Deafness Mother   . Blindness Mother   . 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  acetaminophen (TYLENOL) 325 MG tablet, Take 650 mg by mouth every 6 (six) hours as needed., Disp: , Rfl:  .  acidophilus (RISAQUAD) CAPS capsule, Take by mouth daily., Disp: , Rfl:  .  aspirin-acetaminophen-caffeine (EXCEDRIN MIGRAINE) 250-250-65 MG tablet, Take by mouth every 6 (six) hours as needed for headache., Disp: , Rfl:  .  butalbital-acetaminophen-caffeine (FIORICET) 50-325-40 MG tablet, Take 1-2 tablets by mouth every 6 (six) hours as needed., Disp: 20 tablet, Rfl: 0 .  Calcium-Magnesium-Vitamin D (CALCIUM 500 PO), Take by mouth., Disp: , Rfl:  .  cetirizine-pseudoephedrine (ZYRTEC-D) 5-120 MG tablet, Take  1 tablet by mouth daily., Disp: , Rfl:  .  Cranberry 1000 MG CAPS, Take by mouth., Disp: , Rfl:  .  dextromethorphan-guaiFENesin (MUCINEX DM) 30-600 MG 12hr tablet, Take 1 tablet by mouth 2 (two) times daily., Disp: , Rfl:  .  fluticasone (FLONASE) 50 MCG/ACT nasal spray, , Disp: , Rfl: 0 .  ibuprofen (ADVIL,MOTRIN) 200 MG tablet, Take 200 mg by mouth every 6 (six) hours as needed., Disp: , Rfl:  .  MELATONIN PO, Take 6 mg by mouth., Disp: , Rfl:  .  montelukast (SINGULAIR) 10 MG tablet, , Disp: , Rfl: 0 .  Multiple Vitamin (MULTIVITAMIN) tablet, Take 1 tablet by mouth daily., Disp: , Rfl:  .  Multiple Vitamins-Minerals (ALIVE WOMENS ENERGY PO), Take by mouth., Disp: , Rfl:  .  Multiple Vitamins-Minerals (COMPLETE ENERGY PO), Take by mouth., Disp:  , Rfl:  .  pantoprazole (PROTONIX) 40 MG tablet, Take 1 tablet (40 mg total) by mouth daily., Disp: 30 tablet, Rfl: 1  EXAM:  VITALS per patient if applicable:  GENERAL: alert, oriented, appears well and in no acute distress  PSYCH/NEURO: pleasant and cooperative, no obvious depression or anxiety, speech and thought processing grossly intact  ASSESSMENT AND PLAN:  Discussed the following assessment and plan:  Prediabetes - Plan: Hemoglobin A1c rec healthy diet and exercise   Osteopenia with high frax score try to get prolia approved  -avoid high doses calcium h/o hyperca -try to get prolia approved  HM sch fasting labs 07/2019  Flu shot had 2020 per pt Tdap had 2017/2018 Will look for prevnar, pna 23 vaccines unable to see consider revaccination if cant find  shingrix had 09/07/16 and 02/23/17 [place]  covid vx wants  Pap had 2015 mammo 07/24/2018 normal and had [place] right breast pain negative  -referred for mammo 07/2019  Colonoscopy 08/25/17 diverticulosis  DEXA 07/19/2018 T score -2.3 with high FRAX score see above prolia pending approval  -rec D3 1000 IU daily  A1C 5.9 07/14/2018 prediabetes  --pt wanted referral for nutrition counseling never went due to pandemic  Skin assess in the future for issues   -we discussed possible serious and likely etiologies, options for evaluation and workup, limitations of telemedicine visit vs in person visit, treatment, treatment risks and precautions. Pt prefers to treat via telemedicine empirically rather then risking or undertaking an in person visit at this moment. Patient agrees to seek prompt in person care if worsening, new symptoms arise, or if is not improving with treatment.   I discussed the assessment and treatment plan with the patient. The patient was provided an opportunity to ask questions and all were answered. The patient agreed with the plan and demonstrated an understanding of the instructions.   The patient was advised to  call back or seek an in-person evaluation if the symptoms worsen or if the condition fails to improve as anticipated.  Time spent 25 minutes  Delorise Jackson, MD

## 2019-06-11 ENCOUNTER — Telehealth: Payer: Self-pay | Admitting: Internal Medicine

## 2019-06-11 NOTE — Telephone Encounter (Signed)
I called pt and left a vm for Return in about 6 months (around 12/06/2019).

## 2019-07-05 ENCOUNTER — Telehealth: Payer: Self-pay | Admitting: *Deleted

## 2019-07-05 NOTE — Progress Notes (Signed)
Patient approved By [employer] now need Prior authorization from Insurance will proceed with PA.

## 2019-07-05 NOTE — Telephone Encounter (Signed)
-----   Message from Delorise Jackson, MD sent at 06/08/2019  5:55 PM EST ----- Try to get prolia approved osteopenia high frax score

## 2019-07-05 NOTE — Telephone Encounter (Signed)
Jamie Todd Key: [id number] Prior authorization in process.

## 2019-07-06 ENCOUNTER — Ambulatory Visit: Payer: Medicare Other | Attending: Internal Medicine

## 2019-07-06 DIAGNOSIS — Z23 Encounter for immunization: Secondary | ICD-10-CM | POA: Insufficient documentation

## 2019-07-06 NOTE — Telephone Encounter (Signed)
Patient approved is she ready to be scheduled?

## 2019-07-06 NOTE — Telephone Encounter (Signed)
[employer] medicare called and states that Prolia was approved for a year on 07/05/19

## 2019-07-06 NOTE — Telephone Encounter (Signed)
Call pt and see if wants prolia injections in our office every 6 months  [place]

## 2019-07-09 NOTE — Telephone Encounter (Signed)
Patient is agreeable to in office Prolia injections and would like to be set up for this.   Patient states she is set to have a tooth removed (surgery) and implant placed. Patient wondering if she needs to wait before starting Prolia because of this.

## 2019-07-09 NOTE — Telephone Encounter (Signed)
She needs to review with dental 1st before we start prolia would proceed with dental surgery/implant 1st discuss with dental then she can call back to schedule prolia injections every 6 months [employer]

## 2019-07-10 LAB — HM DIABETES EYE EXAM

## 2019-07-10 NOTE — Telephone Encounter (Signed)
Patient informed and verbalized understanding.  She will call us back.   1st Covid shot is documented in the system. Patient set to get 2nd dose 08/01/19.

## 2019-07-26 ENCOUNTER — Other Ambulatory Visit (INDEPENDENT_AMBULATORY_CARE_PROVIDER_SITE_OTHER): Payer: Medicare Other

## 2019-07-26 ENCOUNTER — Other Ambulatory Visit: Payer: Self-pay | Admitting: Internal Medicine

## 2019-07-26 ENCOUNTER — Other Ambulatory Visit: Payer: Self-pay

## 2019-07-26 DIAGNOSIS — R946 Abnormal results of thyroid function studies: Secondary | ICD-10-CM | POA: Diagnosis not present

## 2019-07-26 DIAGNOSIS — Z0000 Encounter for general adult medical examination without abnormal findings: Secondary | ICD-10-CM | POA: Diagnosis not present

## 2019-07-26 DIAGNOSIS — Z1329 Encounter for screening for other suspected endocrine disorder: Secondary | ICD-10-CM

## 2019-07-26 DIAGNOSIS — R7303 Prediabetes: Secondary | ICD-10-CM

## 2019-07-26 DIAGNOSIS — Z1389 Encounter for screening for other disorder: Secondary | ICD-10-CM

## 2019-07-26 DIAGNOSIS — Z13220 Encounter for screening for lipoid disorders: Secondary | ICD-10-CM

## 2019-07-26 LAB — COMPREHENSIVE METABOLIC PANEL
ALT: 23 U/L (ref 0–35)
AST: 23 U/L (ref 0–37)
Albumin: 4.2 g/dL (ref 3.5–5.2)
Alkaline Phosphatase: 117 U/L (ref 39–117)
BUN: 24 mg/dL — ABNORMAL HIGH (ref 6–23)
CO2: 29 mEq/L (ref 19–32)
Calcium: 9.8 mg/dL (ref 8.4–10.5)
Chloride: 103 mEq/L (ref 96–112)
Creatinine, Ser: 0.91 mg/dL (ref 0.40–1.20)
GFR: 60.05 mL/min (ref >60.00)
Glucose, Bld: 108 mg/dL — ABNORMAL HIGH (ref 70–99)
Potassium: 4.5 mEq/L (ref 3.5–5.1)
Sodium: 135 mEq/L (ref 135–145)
Total Bilirubin: 0.4 mg/dL (ref 0.2–1.2)
Total Protein: 7.7 g/dL (ref 6.0–8.3)

## 2019-07-26 LAB — CBC WITH DIFFERENTIAL/PLATELET
Basophils Absolute: 0 10*3/uL (ref 0.0–0.1)
Basophils Relative: 0.6 % (ref 0.0–3.0)
Eosinophils Absolute: 0.1 10*3/uL (ref 0.0–0.7)
Eosinophils Relative: 2.1 % (ref 0.0–5.0)
HCT: 39.4 % (ref 36.0–46.0)
Hemoglobin: 13 g/dL (ref 12.0–15.0)
Lymphocytes Relative: 32.8 % (ref 12.0–46.0)
Lymphs Abs: 1.7 10*3/uL (ref 0.7–4.0)
MCHC: 33 g/dL (ref 30.0–36.0)
MCV: 91.7 fl (ref 78.0–100.0)
Monocytes Absolute: 0.5 10*3/uL (ref 0.1–1.0)
Monocytes Relative: 8.6 % (ref 3.0–12.0)
Neutro Abs: 2.9 10*3/uL (ref 1.4–7.7)
Neutrophils Relative %: 55.9 % (ref 43.0–77.0)
Platelets: 273 10*3/uL (ref 150.0–400.0)
RBC: 4.3 Mil/uL (ref 3.87–5.11)
RDW: 13.7 % (ref 11.5–15.5)
WBC: 5.3 10*3/uL (ref 4.0–10.5)

## 2019-07-26 LAB — T3, FREE: T3, Free: 3.9 pg/mL (ref 2.3–4.2)

## 2019-07-26 LAB — TSH: TSH: 4.8 u[IU]/mL — ABNORMAL HIGH (ref 0.35–4.50)

## 2019-07-26 LAB — LIPID PANEL
Cholesterol: 205 mg/dL — ABNORMAL HIGH (ref 0–200)
HDL: 62.6 mg/dL (ref >39.00)
LDL Cholesterol: 119 mg/dL — ABNORMAL HIGH (ref 0–99)
NonHDL: 142.07
Total CHOL/HDL Ratio: 3
Triglycerides: 116 mg/dL (ref 0.0–149.0)
VLDL: 23.2 mg/dL (ref 0.0–40.0)

## 2019-07-26 LAB — T4, FREE: Free T4: 0.81 ng/dL (ref 0.60–1.60)

## 2019-07-26 LAB — HEMOGLOBIN A1C: Hgb A1c MFr Bld: 6 % (ref 4.6–6.5)

## 2019-07-27 LAB — THYROID PEROXIDASE ANTIBODY: Thyroperoxidase Ab SerPl-aCnc: <1 IU/mL (ref <9)

## 2019-07-27 LAB — URINALYSIS, ROUTINE W REFLEX MICROSCOPIC
Bacteria, UA: NONE SEEN /HPF
Bilirubin Urine: NEGATIVE
Glucose, UA: NEGATIVE
Hgb urine dipstick: NEGATIVE
Hyaline Cast: NONE SEEN /LPF
Ketones, ur: NEGATIVE
Nitrite: NEGATIVE
Protein, ur: NEGATIVE
Specific Gravity, Urine: 1.021 (ref 1.001–1.03)
Squamous Epithelial / HPF: NONE SEEN /HPF (ref <=5)
pH: <=5 (ref 5.0–8.0)

## 2019-07-29 ENCOUNTER — Other Ambulatory Visit: Payer: Self-pay | Admitting: Internal Medicine

## 2019-07-29 DIAGNOSIS — R7989 Other specified abnormal findings of blood chemistry: Secondary | ICD-10-CM

## 2019-08-01 ENCOUNTER — Ambulatory Visit: Payer: Medicare Other | Attending: Internal Medicine

## 2019-08-01 DIAGNOSIS — Z23 Encounter for immunization: Secondary | ICD-10-CM

## 2019-08-01 NOTE — Progress Notes (Signed)
   Covid-19 Vaccination Clinic  Name:  Jamie Todd    MRN: [id number] DOB: 1941-01-13  08/01/2019  [name] was observed post Covid-19 immunization for 15 minutes without incident. She was provided with Vaccine Information Sheet and instruction to access the V-Safe system.   [name] was instructed to call 911 with any severe reactions post vaccine: Marland Kitchen Difficulty breathing  . Swelling of face and throat  . A fast heartbeat  . A bad rash all over body  . Dizziness and weakness   Immunizations Administered    Name Date Dose VIS Date Route   Pfizer COVID-19 Vaccine 08/01/2019  3:38 PM 0.3 mL 04/20/2019 Intramuscular   Manufacturer: [place]   Lot: [id number]   [place]: [id number]

## 2019-08-06 ENCOUNTER — Encounter: Payer: Self-pay | Admitting: Internal Medicine

## 2019-08-24 ENCOUNTER — Other Ambulatory Visit: Payer: Self-pay | Admitting: Internal Medicine

## 2019-08-24 DIAGNOSIS — R946 Abnormal results of thyroid function studies: Secondary | ICD-10-CM

## 2019-09-03 ENCOUNTER — Other Ambulatory Visit: Payer: Self-pay

## 2019-09-03 ENCOUNTER — Ambulatory Visit
Admission: RE | Admit: 2019-09-03 | Discharge: 2019-09-03 | Disposition: A | Discharge status: Home / Self Care | Payer: Medicare Other | Source: Ambulatory Visit | Attending: Internal Medicine | Admitting: Internal Medicine

## 2019-09-03 DIAGNOSIS — R946 Abnormal results of thyroid function studies: Secondary | ICD-10-CM

## 2019-09-06 ENCOUNTER — Telehealth: Payer: Self-pay | Admitting: Internal Medicine

## 2019-09-06 NOTE — Telephone Encounter (Signed)
Please advise, states pt has been scheduled. Will we need to place a referral?

## 2019-09-06 NOTE — Telephone Encounter (Signed)
Pt requesting a referral for mammogram. Pt states that [facility] stated she needs a referral.

## 2019-09-10 NOTE — Telephone Encounter (Signed)
Referral sent 

## 2019-09-10 NOTE — Telephone Encounter (Signed)
Your right Dr Aundra Dubin pt had order that's how it was scheduled. I'll call pt.

## 2019-09-10 NOTE — Telephone Encounter (Signed)
Good morning!  Yes a order/referral is needed. Please and Thank you!

## 2019-09-10 NOTE — Telephone Encounter (Signed)
Referral in can schedule for 10/2019  Thanks tMS

## 2019-09-10 NOTE — Telephone Encounter (Signed)
Pt will be needing a referral

## 2019-09-10 NOTE — Addendum Note (Signed)
Addended by: Orland Mustard on: 09/10/2019 01:00 PM   Modules accepted: Orders

## 2019-09-10 NOTE — Telephone Encounter (Signed)
I spoke with pt she stated she needs a referral to [facility] to get colonscopy done. Pt stated she will need referral in June. Pt will call back and inform of the to be sent in before date. [facility]. Dr Allen Norris

## 2019-09-10 NOTE — Telephone Encounter (Signed)
Dont know what this means pt stating needing referral mammo ordered appt sch 09/17/19  Place referral if needed per pt   Thanks tMS

## 2019-09-17 ENCOUNTER — Ambulatory Visit
Admission: RE | Admit: 2019-09-17 | Discharge: 2019-09-17 | Disposition: A | Discharge status: Home / Self Care | Payer: Medicare Other | Source: Ambulatory Visit | Attending: Internal Medicine | Admitting: Internal Medicine

## 2019-09-17 DIAGNOSIS — Z1231 Encounter for screening mammogram for malignant neoplasm of breast: Secondary | ICD-10-CM | POA: Insufficient documentation

## 2019-10-26 ENCOUNTER — Other Ambulatory Visit (INDEPENDENT_AMBULATORY_CARE_PROVIDER_SITE_OTHER): Payer: Medicare Other

## 2019-10-26 ENCOUNTER — Other Ambulatory Visit: Payer: Self-pay

## 2019-10-26 DIAGNOSIS — R7989 Other specified abnormal findings of blood chemistry: Secondary | ICD-10-CM | POA: Diagnosis not present

## 2019-10-26 LAB — TSH: TSH: 2.55 u[IU]/mL (ref 0.35–4.50)

## 2019-12-06 ENCOUNTER — Telehealth: Payer: Self-pay | Admitting: Internal Medicine

## 2019-12-06 NOTE — Telephone Encounter (Signed)
Rejection Reason - Patient did not respond" [facility] said 2 days ago

## 2019-12-13 ENCOUNTER — Telehealth: Payer: Self-pay | Admitting: Internal Medicine

## 2019-12-13 NOTE — Telephone Encounter (Signed)
Left message for patient to call back and schedule Medicare Annual Wellness Visit (AWV)   This should be a telephone visit only=30 minutes.  No hx of AWV; please schedule at anytime with Denisa O'Brien-Blaney at [facility]

## 2020-02-07 ENCOUNTER — Other Ambulatory Visit: Payer: Self-pay

## 2020-02-07 ENCOUNTER — Telehealth: Payer: Self-pay

## 2020-02-07 ENCOUNTER — Ambulatory Visit (INDEPENDENT_AMBULATORY_CARE_PROVIDER_SITE_OTHER): Payer: Medicare Other | Admitting: Dermatology

## 2020-02-07 ENCOUNTER — Encounter: Payer: Self-pay | Admitting: Dermatology

## 2020-02-07 DIAGNOSIS — Z1283 Encounter for screening for malignant neoplasm of skin: Secondary | ICD-10-CM | POA: Diagnosis not present

## 2020-02-07 DIAGNOSIS — L814 Other melanin hyperpigmentation: Secondary | ICD-10-CM

## 2020-02-07 DIAGNOSIS — D1800 Hemangioma unspecified site: Secondary | ICD-10-CM

## 2020-02-07 DIAGNOSIS — L821 Other seborrheic keratosis: Secondary | ICD-10-CM

## 2020-02-07 DIAGNOSIS — L578 Other skin changes due to chronic exposure to nonionizing radiation: Secondary | ICD-10-CM

## 2020-02-07 DIAGNOSIS — B351 Tinea unguium: Secondary | ICD-10-CM

## 2020-02-07 DIAGNOSIS — L820 Inflamed seborrheic keratosis: Secondary | ICD-10-CM | POA: Diagnosis not present

## 2020-02-07 DIAGNOSIS — L719 Rosacea, unspecified: Secondary | ICD-10-CM | POA: Diagnosis not present

## 2020-02-07 DIAGNOSIS — I781 Nevus, non-neoplastic: Secondary | ICD-10-CM | POA: Diagnosis not present

## 2020-02-07 DIAGNOSIS — D229 Melanocytic nevi, unspecified: Secondary | ICD-10-CM

## 2020-02-07 MED ORDER — CICLOPIROX OLAMINE 0.77 % EX SUSP: CUTANEOUS | 3 refills | Status: DC

## 2020-02-07 MED ORDER — IVERMECTIN 1 % EX CREA: TOPICAL_CREAM | CUTANEOUS | 2 refills | Status: DC

## 2020-02-07 NOTE — Telephone Encounter (Addendum)
Pharmacy called stating that they do not keep the Ciclopirox .77% suspension in stock, but do have Ciclopirox 8% solution. They want to know if it would be ok to substitute.

## 2020-02-07 NOTE — Telephone Encounter (Signed)
Yes, that's fine. Thank you!

## 2020-02-07 NOTE — Patient Instructions (Signed)
Recommend daily broad spectrum sunscreen SPF 30+ to sun-exposed areas, reapply every 2 hours as needed. Call for new or changing lesions.  Prior to procedure, discussed risks of blister formation, small wound, skin dyspigmentation, or rare scar following cryotherapy.  Liquid nitrogen was applied for 10-12 seconds to the skin lesion and the expected blistering or scabbing reaction explained. Do not pick at the area. Patient reminded to expect hypopigmented scars from the procedure. Return if lesion fails to fully resolve.  Cryotherapy Aftercare  . Wash gently with soap and water everyday.   Marland Kitchen Apply Vaseline and Band-Aid daily until healed.  Call clinic if the area frozen on Right Shin changes

## 2020-02-07 NOTE — Telephone Encounter (Signed)
Left vm for pharmacy advising OK to substitute 8% solution.

## 2020-02-07 NOTE — Progress Notes (Signed)
   Follow-Up Visit   Subjective  Jamie Todd is a 79 y.o. female who presents for the following: TBSE.  Patient here for full body skin exam and skin cancer screening. Patient has h/o BCC Right frontal scalp at hairline 04/23/14 with excision and SCCis Right lat. lower leg. Patient has no areas of concern at this time.  The following portions of the chart were reviewed this encounter and updated as appropriate:  Tobacco  Allergies  Meds  Problems  Med Hx  Surg Hx  Fam Hx      Review of Systems:  No other skin or systemic complaints except as noted in HPI or Assessment and Plan.  Objective  Well appearing patient in no apparent distress; mood and affect are within normal limits.  A full examination was performed including scalp, head, eyes, ears, nose, lips, neck, chest, axillae, abdomen, back, buttocks, bilateral upper extremities, bilateral lower extremities, hands, feet, fingers, toes, fingernails, and toenails. All findings within normal limits unless otherwise noted below.  Objective  Mid Face: Scattered inflammatory papules   Objective  Toe Nails: Nail dystrophy  Objective  Left Lower Leg - Anterior: Blue and purple small veins   Assessment & Plan  Rosacea Mid Face  Has tried and failed metro gel. Chronic and not at goal. Discussed Soolantra, Skin medicinals metronidazole/ivermectin/azelaic acid. She will call if she would like to start.  Ivermectin (SOOLANTRA) 1 % CREA - Mid Face  Onychomycosis Toe Nails  Chronic, improving, not at goal Continue Ciclopirox solution to affected nails once daily  ciclopirox (LOPROX) 0.77 % SUSP - Toe Nails  Inflamed seborrheic keratosis Right Shin  Cryotherapy today Prior to procedure, discussed risks of blister formation, small wound, skin dyspigmentation, or rare scar following cryotherapy.   Call Clinic if isk on right shin changes.  Destruction of lesion - Right Shin  Destruction method: cryotherapy     Informed consent: discussed and consent obtained   Lesion destroyed using liquid nitrogen: Yes   Outcome: patient tolerated procedure well with no complications   Post-procedure details: wound care instructions given    Spider veins Left Lower Leg - Anterior  Discussed sclerotherapy   Lentigines - Scattered tan macules - Discussed due to sun exposure - Benign, observe - Call for any changes  Seborrheic Keratoses - Stuck-on, waxy, tan-brown papules and plaques  - Discussed benign etiology and prognosis. - Observe - Call for any changes  Melanocytic Nevi - Tan-brown and/or pink-flesh-colored symmetric macules and papules - Benign appearing on exam today - Observation - Call clinic for new or changing moles - Recommend daily use of broad spectrum spf 30+ sunscreen to sun-exposed areas.   Hemangiomas - Red papules - Discussed benign nature - Observe - Call for any changes  Actinic Damage - diffuse scaly erythematous macules with underlying dyspigmentation - Recommend daily broad spectrum sunscreen SPF 30+ to sun-exposed areas, reapply every 2 hours as needed.  - Call for new or changing lesions.  Skin cancer screening performed today.   Return in about 1 year (around 02/06/2021) for 1 year TBSE, 2 to 3 months for ISK.  I, Donzetta Kohut, CMA, am acting as scribe for Forest Gleason, MD .  Documentation: I have reviewed the above documentation for accuracy and completeness, and I agree with the above.  Forest Gleason, MD

## 2020-03-01 ENCOUNTER — Encounter: Payer: Self-pay | Admitting: Dermatology

## 2020-04-26 ENCOUNTER — Other Ambulatory Visit: Payer: Self-pay | Admitting: Dermatology

## 2020-05-21 ENCOUNTER — Other Ambulatory Visit: Payer: Self-pay

## 2020-05-21 ENCOUNTER — Ambulatory Visit: Payer: Medicare Other | Admitting: Dermatology

## 2020-05-21 DIAGNOSIS — L820 Inflamed seborrheic keratosis: Secondary | ICD-10-CM | POA: Diagnosis not present

## 2020-05-21 DIAGNOSIS — L719 Rosacea, unspecified: Secondary | ICD-10-CM | POA: Diagnosis not present

## 2020-05-21 MED ORDER — DOXYCYCLINE HYCLATE 20 MG PO TABS: 20.0000 mg | ORAL_TABLET | Freq: Two times a day (BID) | ORAL | 1 refills | Status: AC

## 2020-05-21 NOTE — Progress Notes (Signed)
   Follow-Up Visit   Subjective  Jamie Todd is a 80 y.o. female who presents for the following: irritated seborrheic keratosis (Recheck today - patient has noticed an improvement, but it is still present) and Rosacea (Patient using Metronidazole 0.75% cream QD - has noticed an improvement, but still getting flares. Patient would like refills of medication.). She also notes a spot on her leg that is rough.  The following portions of the chart were reviewed this encounter and updated as appropriate:   Tobacco  Allergies  Meds  Problems  Med Hx  Surg Hx  Fam Hx     Review of Systems:  No other skin or systemic complaints except as noted in HPI or Assessment and Plan.  Objective  Well appearing patient in no apparent distress; mood and affect are within normal limits.  A focused examination was performed including the face and right legs. Relevant physical exam findings are noted in the Assessment and Plan.  Objective  R shin: Erythematous keratotic or waxy stuck-on papule or plaque.   Objective  Face: Scattered inflammatory papules of the mid face.  Assessment & Plan  Inflamed seborrheic keratosis R shin  Benign appearing.   Patient bothered by texture  Recommend Amlactin Rapid Relief cream or ammonium lactate cream daily.   Rosacea Face  Chronic condition with expected duration over one year. Condition is bothersome to patient. Currently flared.  Rosacea is a chronic progressive skin condition usually affecting the face of adults, causing redness and/or acne bumps. It is treatable but not curable. It sometimes affects the eyes (ocular rosacea) as well. It may respond to topical and/or systemic medication and can flare with stress, sun exposure, alcohol, exercise and some foods.  Daily application of broad spectrum spf 30+ sunscreen to face is recommended to reduce flares.  Pt c/o grittiness of the eyes suggestive of ocular rosacea  Start Doxycycline 5m po BID.  Doxycycline should be taken with food to prevent nausea. Do not lay down for 30 minutes after taking. Be cautious with sun exposure and use good sun protection while on this medication. Pregnant women should not take this medication.   Continue Metronidazole 0.75% cream BID.   Consider topical treatment (Soolantra or Skin Medicinals mix) if no change in ocular symptoms. Patient to follow up with ophthalmologist if eye grittiness doesn't improve while on Doxycycline, and we will consider change to other topical treatment.    doxycycline (PERIOSTAT) 20 MG tablet - Face  Other Related Medications Ivermectin (SOOLANTRA) 1 % CREA  Return in about 6 weeks (around 07/02/2020) for rosacea and ISK recheck.  ILuther Redo CMA, am acting as scribe for VForest Gleason MD .  Documentation: I have reviewed the above documentation for accuracy and completeness, and I agree with the above.  VForest Gleason MD

## 2020-06-04 ENCOUNTER — Encounter: Payer: Self-pay | Admitting: Dermatology

## 2020-07-09 ENCOUNTER — Ambulatory Visit: Payer: Medicare Other | Admitting: Dermatology

## 2020-07-15 ENCOUNTER — Other Ambulatory Visit: Payer: Self-pay | Admitting: Dermatology

## 2020-07-16 ENCOUNTER — Encounter: Payer: Self-pay | Admitting: Internal Medicine

## 2020-07-16 LAB — HM DIABETES EYE EXAM

## 2020-08-18 IMAGING — CT CT ANGIOGRAPHY CHEST
2 of 6 series · 18 of 46 positions shown · IV contrast (APPLIED)
Comparison: None.

CLINICAL DATA: 77-year-old female with low-grade temperature and
cough

EXAM:
CT ANGIOGRAPHY CHEST WITH CONTRAST
TECHNIQUE: Multidetector CT imaging of the chest was performed using the
standard protocol during bolus administration of intravenous
contrast. Multiplanar CT image reconstructions and MIPs were
obtained to evaluate the vascular anatomy.
CONTRAST:  75mL OMNIPAQUE IOHEXOL 350 MG/ML SOLN

[Series 5: thins · axial · 0.63mm/px · z∈[-202,-14]mm · 15 of 206 slices shown]
[im 9/206  lung]
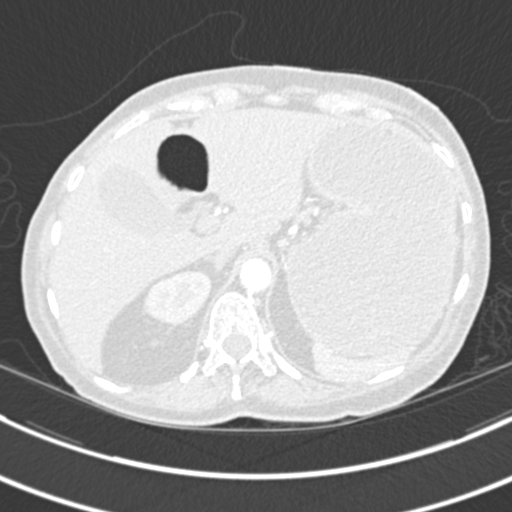
[im 27/206  soft-tissue]
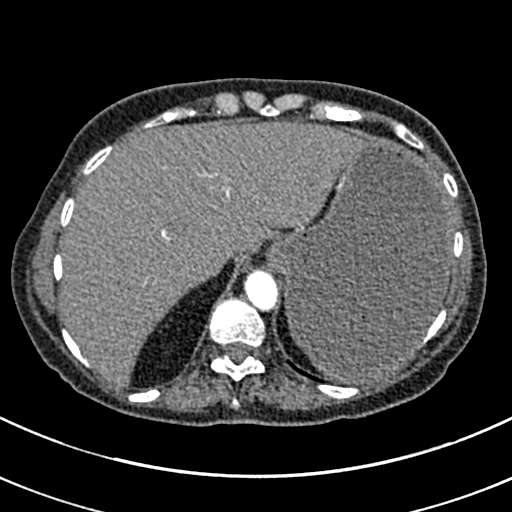
[im 36/206  lung]
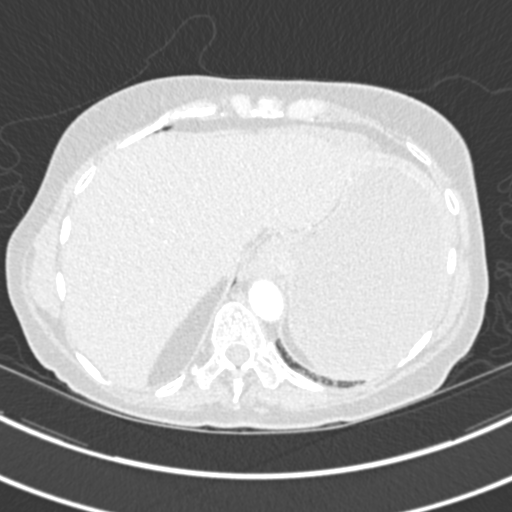
[im 54/206  soft-tissue]
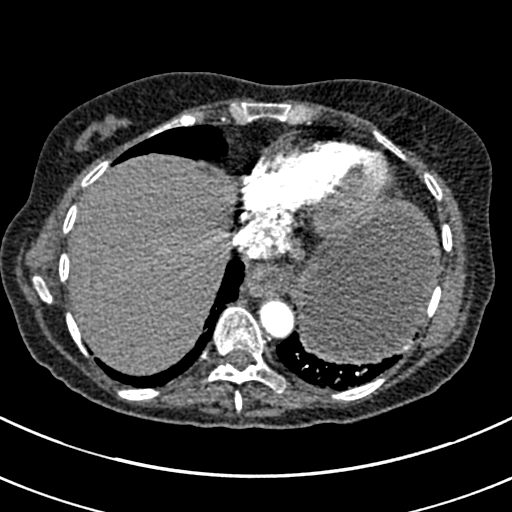
[im 63/206  lung]
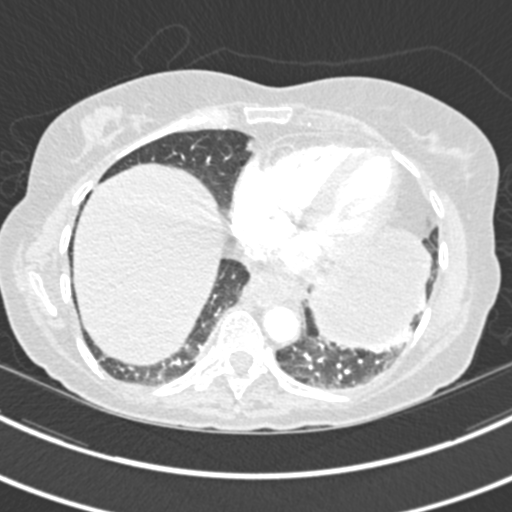
[im 81/206  soft-tissue]
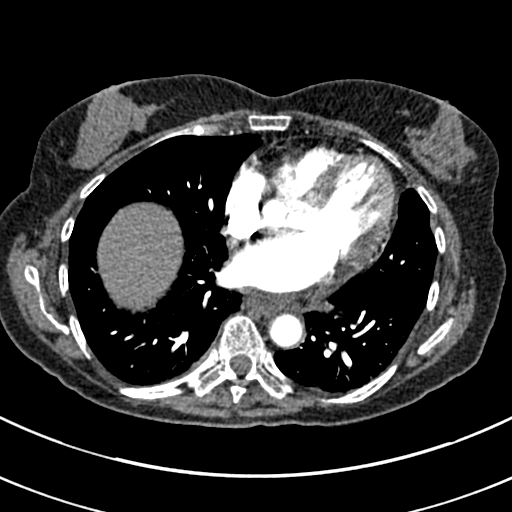
[im 90/206  lung]
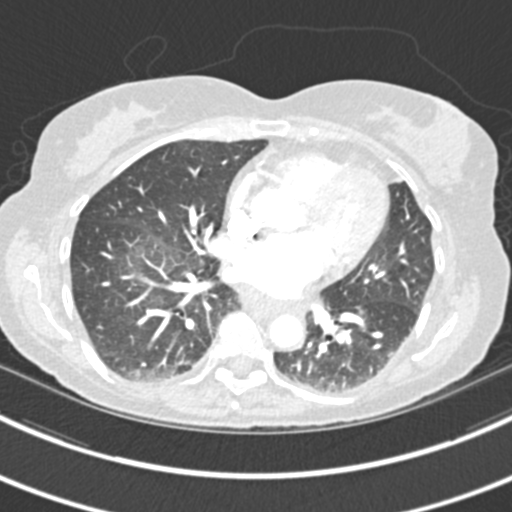
[im 107/206  soft-tissue]
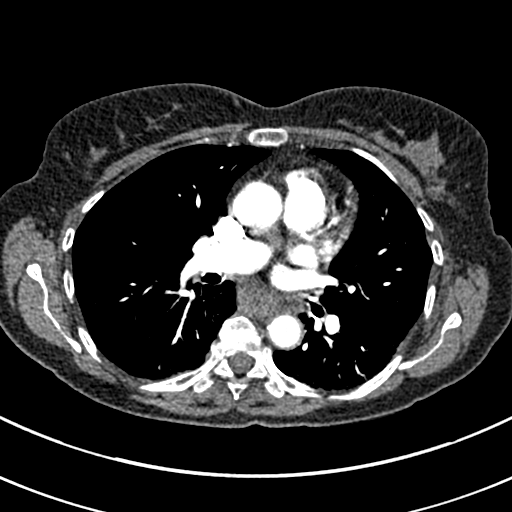
[im 116/206  lung]
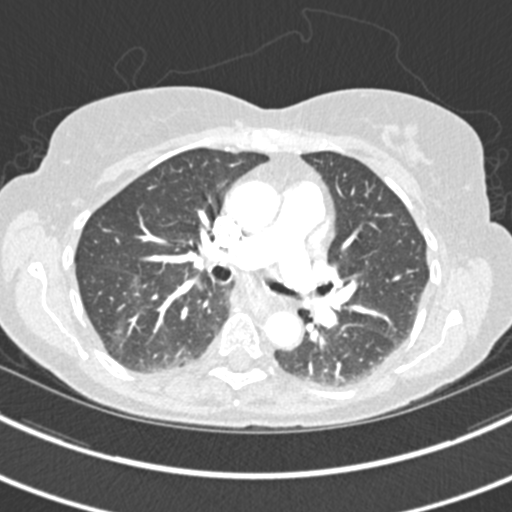
[im 125/206  soft-tissue]
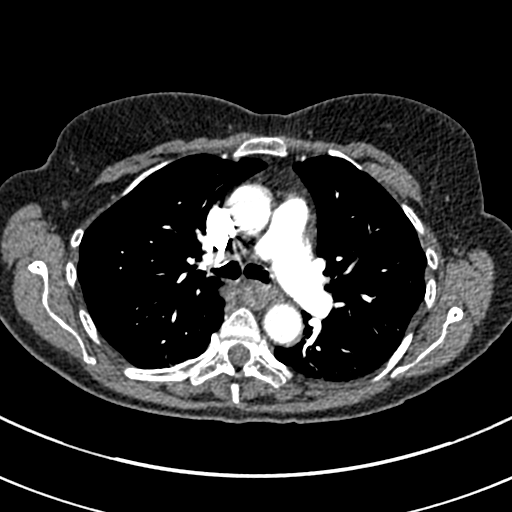
[im 143/206  lung]
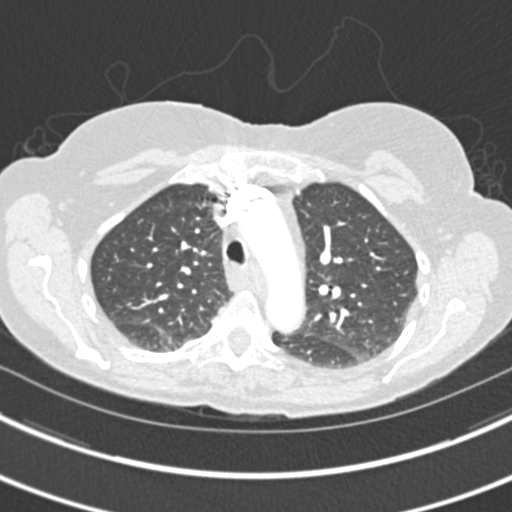
[im 152/206  soft-tissue]
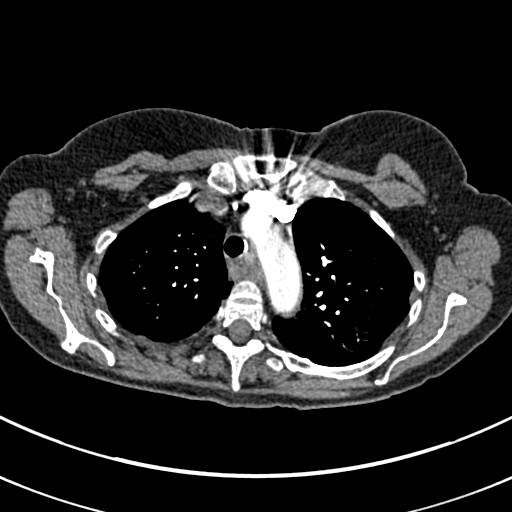
[im 170/206  lung]
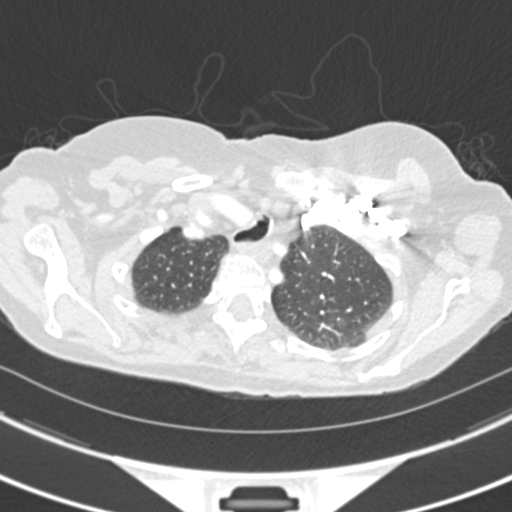
[im 179/206  soft-tissue]
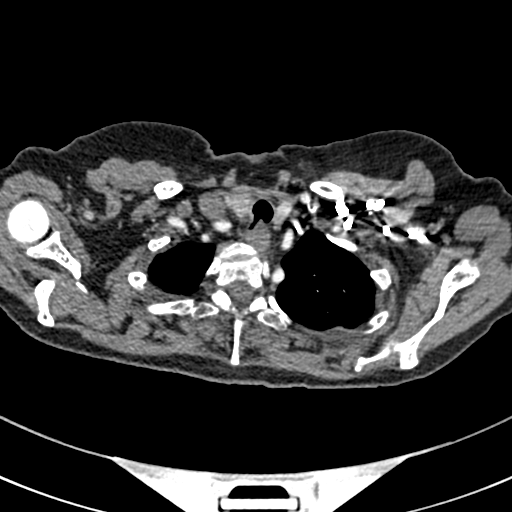
[im 197/206  lung]
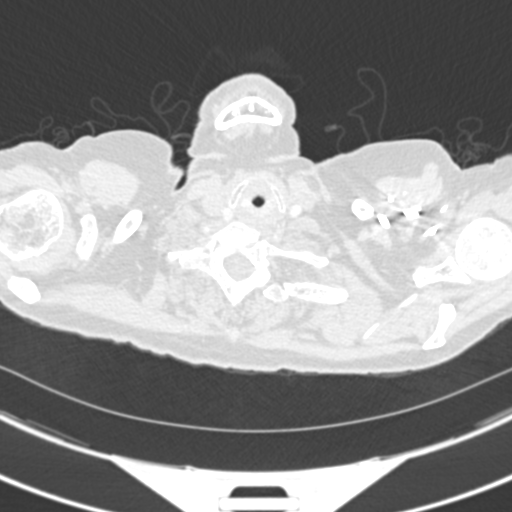

[Series 7: coronal mpr · coronal · 0.45mm/px · 3 of 81 slices shown]
[im 21/81  soft-tissue]
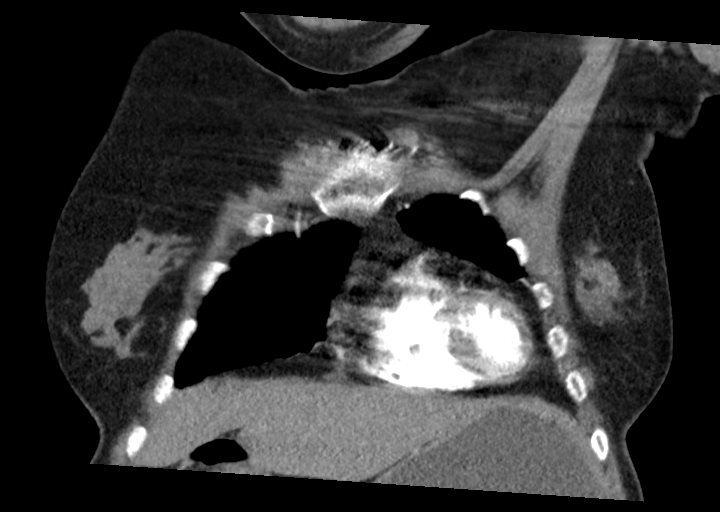
[im 41/81  soft-tissue]
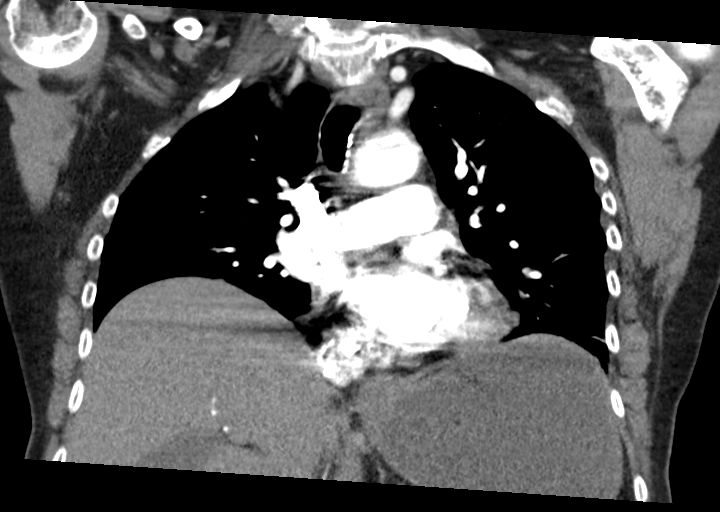
[im 61/81  soft-tissue]
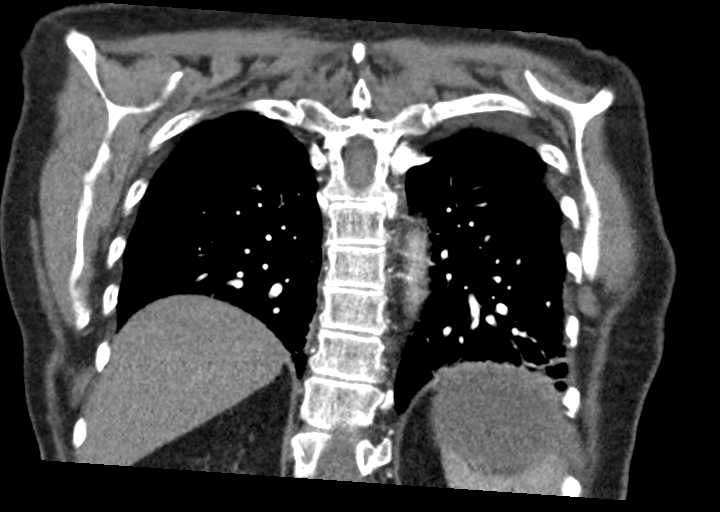

[18 of 46 positions shown; findings below may reference images not displayed]

FINDINGS: Cardiovascular:

Heart:

No cardiomegaly. No pericardial fluid/thickening. No significant
coronary calcifications.

Aorta:

Unremarkable course, caliber, contour of the thoracic aorta. No
aneurysm or dissection flap. No periaortic fluid.

Pulmonary arteries:

No central, lobar, segmental, or proximal subsegmental filling
defects.

Mediastinum/Nodes: The length of the thoracic esophagus is
circumferentially thickened, with poor definition of the tissue/fat
plane. No adenopathy.

Unremarkable thoracic inlet.

Lungs/Pleura: Respiratory motion. No pleural effusion or
pneumothorax. Atelectasis at the lung bases. No edema.

No endobronchial debris or bronchial wall thickening.

Upper Abdomen: Stomach distension, incompletely imaged.

Musculoskeletal: No acute displaced fracture. Degenerative changes
of the spine.

Review of the MIP images confirms the above findings.
IMPRESSION: CT negative for pulmonary emboli.

The length of the thoracic esophagus is circumferentially thickened.
Correlation with symptoms of dysphagia/GERD recommended, as well as
referral for GI evaluation and possible upper endoscopy, as
malignancy cannot be excluded.

## 2020-09-11 ENCOUNTER — Ambulatory Visit: Payer: Medicare Other | Admitting: Dermatology

## 2020-09-11 ENCOUNTER — Other Ambulatory Visit: Payer: Self-pay

## 2020-09-11 DIAGNOSIS — L719 Rosacea, unspecified: Secondary | ICD-10-CM

## 2020-09-11 DIAGNOSIS — L821 Other seborrheic keratosis: Secondary | ICD-10-CM | POA: Diagnosis not present

## 2020-09-11 NOTE — Patient Instructions (Addendum)
Rosacea  What is rosacea? Rosacea (say: ro-zay-sha) is a common skin disease that usually begins as a trend of flushing or blushing easily.  As rosacea progresses, a persistent redness in the center of the face will develop and may gradually spread beyond the nose and cheeks to the forehead and chin.  In some cases, the ears, chest, and back could be affected.  Rosacea may appear as tiny blood vessels or small red bumps that occur in crops.  Frequently they can contain pus, and are called "pustules".  If the bumps do not contain pus, they are referred to as "papules".  Rarely, in prolonged, untreated cases of rosacea, the oil glands of the nose and cheeks may become permanently enlarged.  This is called rhinophyma, and is seen more frequently in men.  Signs and Risks In its beginning stages, rosacea tends to come and go, which makes it difficult to recognize.  It can start as intermittent flushing of the face.  Eventually, blood vessels may become permanently visible.  Pustules and papules can appear, but can be mistaken for adult acne.  People of all races, ages, genders and ethnic groups are at risk of developing rosacea.  However, it is more common in women (especially around menopause) and adults with fair skin between the ages of 66 and 4.  Treatment Dermatologists typically recommend a combination of treatments to effectively manage rosacea.  Treatment can improve symptoms and may stop the progression of the rosacea.  Treatment may involve both topical and oral medications.  The tetracycline antibiotics are often used for their anti-inflammatory effect; however, because of the possibility of developing antibiotic resistance, they should not be used long term at full dose.  For dilated blood vessels the options include electrodessication (uses electric current through a small needle), laser treatment, and cosmetics to hide the redness.   With all forms of treatment, improvement is a slow process, and  patients may not see any results for the first 3-4 weeks.  It is very important to avoid the sun and other triggers.  Patients must wear sunscreen daily.  Skin Care Instructions: 1. Cleanse the skin with a mild soap such as CeraVe cleanser, Cetaphil cleanser, or Dove soap once or twice daily as needed. 2. Moisturize with Eucerin Redness Relief Daily Perfecting Lotion (has a subtle green tint), CeraVe Moisturizing Cream, or Oil of Olay Daily Moisturizer with sunscreen every morning and/or night as recommended. 3. Makeup should be "non-comedogenic" (won't clog pores) and be labeled "for sensitive skin". Good choices for cosmetics are: Neutrogena, Almay, and Physician's Formula.  Any product with a green tint tends to offset a red complexion. 4. If your eyes are dry and irritated, use artificial tears 2-3 times per day and cleanse the eyelids daily with baby shampoo.  Have your eyes examined at least every 2 years.  Be sure to tell your eye doctor that you have rosacea. 5. Alcoholic beverages tend to cause flushing of the skin, and may make rosacea worse. 6. Always wear sunscreen, protect your skin from extreme hot and cold temperatures, and avoid spicy foods, hot drinks, and mechanical irritation such as rubbing, scrubbing, or massaging the face.  Avoid harsh skin cleansers, cleansing masks, astringents, and exfoliation. If a particular product burns or makes your face feel tight, then it is likely to flare your rosacea. 7. If you are having difficulty finding a sunscreen that you can tolerate, you may try switching to a chemical-free sunscreen.  These are ones whose active  ingredient is zinc oxide or titanium dioxide only.  They should also be fragrance free, non-comedogenic, and labeled for sensitive skin. 8. Rosacea triggers may vary from person to person.  There are a variety of foods that have been reported to trigger rosacea.  Some patients find that keeping a diary of what they were doing when they  flared helps them avoid triggers.  For temporary redness reduction Mix 1 bottle of Afrin original in 1 bottle of Cerave PM, shake well, and apply 1 hour before you want redness to be improved in the morning  Continue metronidazole 0.75% cream twice daily.  Recommend daily broad spectrum sunscreen SPF 30+ to sun-exposed areas, reapply every 2 hours as needed. Call for new or changing lesions.  Staying in the shade or wearing long sleeves, sun glasses (UVA+UVB protection) and wide brim hats (4-inch brim around the entire circumference of the hat) are also recommended for sun protection.   Recommend taking Heliocare sun protection supplement daily in sunny weather for additional sun protection. For maximum protection on the sunniest days, you can take up to 2 capsules of regular Heliocare OR take 1 capsule of Heliocare Ultra. For prolonged exposure (such as a full day in the sun), you can repeat your dose of the supplement 4 hours after your first dose. Heliocare can be purchased at [facility] or at [email or web address].   If you have any questions or concerns for your doctor, please call our main line at [phone] and press option 4 to reach your doctor's medical assistant. If no one answers, please leave a voicemail as directed and we will return your call as soon as possible. Messages left after 4 pm will be answered the following business day.   You may also send [place] a message via [place]. We typically respond to MyChart messages within 1-2 business days.  For prescription refills, please ask your pharmacy to contact our office. Our fax number is [phone].  If you have an urgent issue when the clinic is closed that cannot wait until the next business day, you can page your doctor at the number below.    Please note that while we do our best to be available for urgent issues outside of office hours, we are not available 24/7.   If you have an urgent issue and are unable to reach  [place], you may choose to seek medical care at your doctor's office, retail clinic, urgent care center, or emergency room.  If you have a medical emergency, please immediately call 911 or go to the emergency department.  Pager Numbers  - Dr. Nehemiah Massed: [phone]  - Dr. Laurence Ferrari: [phone]  - Dr. Nicole Kindred: [phone]  In the event of inclement weather, please call our main line at [phone] for an update on the status of any delays or closures.  Dermatology Medication Tips: Please keep the boxes that topical medications come in in order to help keep track of the instructions about where and how to use these. Pharmacies typically print the medication instructions only on the boxes and not directly on the medication tubes.   If your medication is too expensive, please contact our office at [phone] option 4 or send [place] a message through [place].   We are unable to tell what your co-pay for medications will be in advance as this is different depending on your insurance coverage. However, we may be able to find a substitute medication at lower cost or fill out paperwork to get insurance to cover  a needed medication.   If a prior authorization is required to get your medication covered by your insurance company, please allow [place] 1-2 business days to complete this process.  Drug prices often vary depending on where the prescription is filled and some pharmacies may offer cheaper prices.  The website www.goodrx.com contains coupons for medications through different pharmacies. The prices here do not account for what the cost may be with help from insurance (it may be cheaper with your insurance), but the website can give you the price if you did not use any insurance.  - You can print the associated coupon and take it with your prescription to the pharmacy.  - You may also stop by our office during regular business hours and pick up a GoodRx coupon card.  - If you need your prescription sent  electronically to a different pharmacy, notify our office through [facility] or by phone at [phone] option 4.

## 2020-09-11 NOTE — Progress Notes (Signed)
   Follow-Up Visit   Subjective  Jamie Todd is a 80 y.o. 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 She is using metronidazole 0.75% cream once daily. Patient did take doxycycline 71m BID for 1 month. ).  Patient also here to follow up for ISK at left shin. Treated with LN2 in September, patient advises it is finally starting to heal.   The following portions of the chart were reviewed this encounter and updated as appropriate:   Tobacco  Allergies  Meds  Problems  Med Hx  Surg Hx  Fam Hx      Review of Systems:  No other skin or systemic complaints except as noted in HPI or Assessment and Plan.  Objective  Well appearing patient in no apparent distress; mood and affect are within normal limits.  A focused examination was performed including face, legs. Relevant physical exam findings are noted in the Assessment and Plan.  Objective  Face: Mid face erythema with few inflammatory papules   Assessment & Plan  Rosacea Face  Chronic condition with duration or expected duration over one year. Condition is bothersome to patient. Not currently at goal.  Discussed treating redness with laser vs Rhofade  For temporary redness reduction Mix 1 bottle of Afrin original in 1 bottle of Cerave PM, shake well, and apply 1 hour before you want redness to be improved in the morning  Continue metronidazole 0.75% cream twice a day  Seborrheic Keratoses - Stuck-on, waxy, tan-brown papules and/or plaques  - Benign-appearing - Discussed benign etiology and prognosis. - Observe - Call for any changes  Return in about 4 months (around 01/12/2021) for TBSE.  IGraciella Belton RMA, am acting as scribe for VForest Gleason MD .  Documentation: I have reviewed the above documentation for accuracy and completeness, and I agree with the above.  VForest Gleason MD

## 2020-09-22 ENCOUNTER — Encounter: Payer: Self-pay | Admitting: Dermatology

## 2020-12-30 ENCOUNTER — Telehealth: Payer: Self-pay | Admitting: Internal Medicine

## 2020-12-30 NOTE — Telephone Encounter (Signed)
Last Note on patient says to discuss with Dentist  and call back to schedule Prolia. Patient would need appointment with PCP to discuss Prolia and would Vit D level and calcium.

## 2021-01-14 ENCOUNTER — Ambulatory Visit: Payer: Medicare Other | Admitting: Dermatology

## 2021-01-27 ENCOUNTER — Other Ambulatory Visit: Payer: Self-pay

## 2021-01-27 ENCOUNTER — Encounter: Payer: Self-pay | Admitting: Dermatology

## 2021-01-27 ENCOUNTER — Ambulatory Visit: Payer: Medicare Other | Admitting: Dermatology

## 2021-01-27 DIAGNOSIS — L304 Erythema intertrigo: Secondary | ICD-10-CM

## 2021-01-27 DIAGNOSIS — Z86007 Personal history of in-situ neoplasm of skin: Secondary | ICD-10-CM

## 2021-01-27 DIAGNOSIS — L821 Other seborrheic keratosis: Secondary | ICD-10-CM

## 2021-01-27 DIAGNOSIS — L578 Other skin changes due to chronic exposure to nonionizing radiation: Secondary | ICD-10-CM

## 2021-01-27 DIAGNOSIS — L570 Actinic keratosis: Secondary | ICD-10-CM | POA: Diagnosis not present

## 2021-01-27 DIAGNOSIS — Z1283 Encounter for screening for malignant neoplasm of skin: Secondary | ICD-10-CM | POA: Diagnosis not present

## 2021-01-27 DIAGNOSIS — D229 Melanocytic nevi, unspecified: Secondary | ICD-10-CM

## 2021-01-27 DIAGNOSIS — Z85828 Personal history of other malignant neoplasm of skin: Secondary | ICD-10-CM | POA: Diagnosis not present

## 2021-01-27 DIAGNOSIS — D1800 Hemangioma unspecified site: Secondary | ICD-10-CM

## 2021-01-27 DIAGNOSIS — L814 Other melanin hyperpigmentation: Secondary | ICD-10-CM

## 2021-01-27 DIAGNOSIS — L719 Rosacea, unspecified: Secondary | ICD-10-CM | POA: Diagnosis not present

## 2021-01-27 MED ORDER — HYDROCORTISONE 2.5 % EX CREA: TOPICAL_CREAM | Freq: Two times a day (BID) | CUTANEOUS | 2 refills | Status: DC | PRN

## 2021-01-27 MED ORDER — IVERMECTIN 1 % EX CREA: 1.0000 "application " | TOPICAL_CREAM | Freq: Every day | CUTANEOUS | 2 refills | Status: DC

## 2021-01-27 MED ORDER — DOXYCYCLINE HYCLATE 20 MG PO TABS: 20.0000 mg | ORAL_TABLET | Freq: Two times a day (BID) | ORAL | 1 refills | Status: AC

## 2021-01-27 MED ORDER — KETOCONAZOLE 2 % EX CREA: 1.0000 "application " | TOPICAL_CREAM | Freq: Two times a day (BID) | CUTANEOUS | 2 refills | Status: AC

## 2021-01-27 NOTE — Patient Instructions (Addendum)
Recommend Niacinamide or Nicotinamide 546m twice per day to lower risk of non-melanoma skin cancer by approximately 25%. This is usually available at [employer].   Cryotherapy Aftercare  Wash gently with soap and water everyday.   Apply Vaseline and Band-Aid daily until healed.   Prior to procedure, discussed risks of blister formation, small wound, skin dyspigmentation, or rare scar following cryotherapy. Recommend Vaseline ointment to treated areas while healing.  Melanoma ABCDEs  Melanoma is the most dangerous type of skin cancer, and is the leading cause of death from skin disease.  You are more likely to develop melanoma if you: Have light-colored skin, light-colored eyes, or red or blond hair Spend a lot of time in the sun Tan regularly, either outdoors or in a tanning bed Have had blistering sunburns, especially during childhood Have a close family member who has had a melanoma Have atypical moles or large birthmarks  Early detection of melanoma is key since treatment is typically straightforward and cure rates are extremely high if we catch it early.   The first sign of melanoma is often a change in a mole or a new dark spot.  The ABCDE system is a way of remembering the signs of melanoma.  A for asymmetry:  The two halves do not match. B for border:  The edges of the growth are irregular. C for color:  A mixture of colors are present instead of an even brown color. D for diameter:  Melanomas are usually (but not always) greater than 610m- the size of a pencil eraser. E for evolution:  The spot keeps changing in size, shape, and color.  Please check your skin once per month between visits. You can use a small mirror in front and a large mirror behind you to keep an eye on the back side or your body.   If you see any new or changing lesions before your next follow-up, please call to schedule a visit.  Please continue daily skin protection including broad spectrum  sunscreen SPF 30+ to sun-exposed areas, reapplying every 2 hours as needed when you're outdoors.    Recommend taking Heliocare sun protection supplement daily in sunny weather for additional sun protection. For maximum protection on the sunniest days, you can take up to 2 capsules of regular Heliocare OR take 1 capsule of Heliocare Ultra. For prolonged exposure (such as a full day in the sun), you can repeat your dose of the supplement 4 hours after your first dose. Heliocare can be purchased at [facility] at [email or web address]   If you have any questions or concerns for your doctor, please call our main line at [phone] press option 4 to reach your doctor's medical assistant. If no one answers, please leave a voicemail as directed and we will return your call as soon as possible. Messages left after 4 pm will be answered the following business day.   You may also [place] message via [place] typically respond to MyChart messages within 1-2 business days.  For prescription refills, please ask your pharmacy to contact our office. Our fax number is [phone] If you have an urgent issue when the clinic is closed that cannot wait until the next business day, you can page your doctor at the number below.    Please note that while we do our best to be available for urgent issues outside of office hours, we are not available 24/7.   If you have an  urgent issue and are unable to reach [place], you may choose to seek medical care at your doctor's office, retail clinic, urgent care center, or emergency room.  If you have a medical emergency, please immediately call 911 or go to the emergency department.  Pager Numbers  - Dr. Nehemiah Massed: [phone]  - Dr. Laurence Ferrari: [phone]  - Dr. Nicole Kindred: [phone]  In the event of inclement weather, please call our main line at [phone] for an update on the status of any delays or closures.  Dermatology Medication Tips: Please  keep the boxes that topical medications come in in order to help keep track of the instructions about where and how to use these. Pharmacies typically print the medication instructions only on the boxes and not directly on the medication tubes.   If your medication is too expensive, please contact our office at [phone] option 4 or send [place] a message through [place].   We are unable to tell what your co-pay for medications will be in advance as this is different depending on your insurance coverage. However, we may be able to find a substitute medication at lower cost or fill out paperwork to get insurance to cover a needed medication.   If a prior authorization is required to get your medication covered by your insurance company, please allow [place] 1-2 business days to complete this process.  Drug prices often vary depending on where the prescription is filled and some pharmacies may offer cheaper prices.  The website www.goodrx.com contains coupons for medications through different pharmacies. The prices here do not account for what the cost may be with help from insurance (it may be cheaper with your insurance), but the website can give you the price if you did not use any insurance.  - You can print the associated coupon and take it with your prescription to the pharmacy.  - You may also stop by our office during regular business hours and pick up a GoodRx coupon card.  - If you need your prescription sent electronically to a different pharmacy, notify our office through [facility] or by phone at [phone] option 4.  Intertrigo is a chronic recurrent rash that occurs in skin fold areas that may be associated with friction; heat; moisture; yeast; fungus; and bacteria.  It is exacerbated by increased movement / activity; sweating; and higher atmospheric temperature.  Start HC 2.5% twice daily for up to 1 week at a time.  Start ketoconazole 2% cream twice daily as needed.    Recommend over the counter antifungal powder for prevention once cleared.

## 2021-01-27 NOTE — Progress Notes (Signed)
Follow-Up Visit   Subjective  Jamie Todd is a 80 y.o. female who presents for the following: FBSE (Patient here for full body skin exam and skin cancer screening. Patient with hx of SCC, BCC and rosacea. She is currently using metonidazole 0.75% once daily with occasional flares. Patient not aware of any new or changing spots. ).  The following portions of the chart were reviewed this encounter and updated as appropriate:   Tobacco  Allergies  Meds  Problems  Med Hx  Surg Hx  Fam Hx      Review of Systems:  No other skin or systemic complaints except as noted in HPI or Assessment and Plan.  Objective  Well appearing patient in no apparent distress; mood and affect are within normal limits.  A full examination was performed including scalp, head, eyes, ears, nose, lips, neck, chest, axillae, abdomen, back, buttocks, bilateral upper extremities, bilateral lower extremities, hands, feet, fingers, toes, fingernails, and toenails. All findings within normal limits unless otherwise noted below.  face Mid face erythema with rare inflammatory papule  left superior breast x 1, left forehead x 1 Erythematous thin papules/macules with gritty scale.   inframammary bilateral Erythematous patches   Assessment & Plan  Rosacea face  Chronic condition with duration or expected duration over one year. Condition is bothersome to patient. Not currently at goal.  Rosacea is a chronic progressive skin condition usually affecting the face of adults, causing redness and/or acne bumps. It is treatable but not curable. It sometimes affects the eyes (ocular rosacea) as well. It may respond to topical and/or systemic medication and can flare with stress, sun exposure, alcohol, exercise and some foods.  Daily application of broad spectrum spf 30+ sunscreen to face is recommended to reduce flares.  D/C metronidazole cream Patient does have irritation and grittiness of the eyes which may be due  to ocular rosacea Start doxycycline 30m twice daily with food.  If no change in ocular symptoms, would d/c doxycycline and start soolantra.  Doxycycline should be taken with food to prevent nausea. Do not lay down for 30 minutes after taking. Be cautious with sun exposure and use good sun protection while on this medication. Pregnant women should not take this medication.   doxycycline (PERIOSTAT) 20 MG tablet - face Take 1 tablet (20 mg total) by mouth 2 (two) times daily. Take with food  AK (actinic keratosis) left superior breast x 1, left forehead x 1  Prior to procedure, discussed risks of blister formation, small wound, skin dyspigmentation, or rare scar following cryotherapy. Recommend Vaseline ointment to treated areas while healing.  If left superior breast not resolved, consider bx to r/o superficial BCC.    Destruction of lesion - left superior breast x 1, left forehead x 1  Destruction method: cryotherapy   Informed consent: discussed and consent obtained   Lesion destroyed using liquid nitrogen: Yes   Cryotherapy cycles:  2 Outcome: patient tolerated procedure well with no complications   Post-procedure details: wound care instructions given    Erythema intertrigo inframammary bilateral  Intertrigo is a chronic recurrent rash that occurs in skin fold areas that may be associated with friction; heat; moisture; yeast; fungus; and bacteria.  It is exacerbated by increased movement / activity; sweating; and higher atmospheric temperature.  Start HC 2.5% twice daily for up to 1 week at a time.  Start ketoconazole 2% cream twice daily as needed.   Recommend over the counter antifungal powder for prevention once  cleared.   Topical steroids (such as triamcinolone, fluocinolone, fluocinonide, mometasone, clobetasol, halobetasol, betamethasone, hydrocortisone) can cause thinning and lightening of the skin if they are used for too long in the same area. Your physician has  selected the right strength medicine for your problem and area affected on the body. Please use your medication only as directed by your physician to prevent side effects.    hydrocortisone 2.5 % cream - inframammary bilateral Apply topically 2 (two) times daily as needed (Rash).  ketoconazole (NIZORAL) 2 % cream - inframammary bilateral Apply 1 application topically 2 (two) times daily.  Lentigines - Scattered tan macules - Due to sun exposure - Benign-appearing, observe - Recommend daily broad spectrum sunscreen SPF 30+ to sun-exposed areas, reapply every 2 hours as needed. - Call for any changes  Seborrheic Keratoses - Stuck-on, waxy, tan-brown papules and/or plaques  - Benign-appearing - Discussed benign etiology and prognosis. - Observe - Call for any changes  Melanocytic Nevi - Tan-brown and/or pink-flesh-colored symmetric macules and papules - Benign appearing on exam today - Observation - Call clinic for new or changing moles - Recommend daily use of broad spectrum spf 30+ sunscreen to sun-exposed areas.   Hemangiomas - Red papules - Discussed benign nature - Observe - Call for any changes  Actinic Damage - Chronic condition, secondary to cumulative UV/sun exposure - diffuse scaly erythematous macules with underlying dyspigmentation - Recommend daily broad spectrum sunscreen SPF 30+ to sun-exposed areas, reapply every 2 hours as needed.  - Staying in the shade or wearing long sleeves, sun glasses (UVA+UVB protection) and wide brim hats (4-inch brim around the entire circumference of the hat) are also recommended for sun protection.  - Call for new or changing lesions.  Skin cancer screening performed today.  History of Basal Cell Carcinoma of the Skin - No evidence of recurrence today - Recommend regular full body skin exams - Recommend daily broad spectrum sunscreen SPF 30+ to sun-exposed areas, reapply every 2 hours as needed.  - Call if any new or changing  lesions are noted between office visits  History of Squamous Cell Carcinoma in Situ of the Skin - No evidence of recurrence today - Recommend regular full body skin exams - Recommend daily broad spectrum sunscreen SPF 30+ to sun-exposed areas, reapply every 2 hours as needed.  - Call if any new or changing lesions are noted between office visits  Return in about 3 months (around 04/28/2021) for Rosacea, AK follow up.  Graciella Belton, RMA, am acting as scribe for Forest Gleason, MD .   Documentation: I have reviewed the above documentation for accuracy and completeness, and I agree with the above.  Forest Gleason, MD

## 2021-02-11 ENCOUNTER — Encounter: Payer: Medicare Other | Admitting: Dermatology

## 2021-02-24 ENCOUNTER — Emergency Department
Admission: EM | Admit: 2021-02-24 | Discharge: 2021-02-24 | Disposition: A | Discharge status: Home / Self Care | Payer: Medicare Other | Attending: Emergency Medicine | Admitting: Emergency Medicine

## 2021-02-24 ENCOUNTER — Other Ambulatory Visit: Payer: Self-pay

## 2021-02-24 ENCOUNTER — Emergency Department: Payer: Medicare Other

## 2021-02-24 DIAGNOSIS — Z85828 Personal history of other malignant neoplasm of skin: Secondary | ICD-10-CM | POA: Insufficient documentation

## 2021-02-24 DIAGNOSIS — B349 Viral infection, unspecified: Secondary | ICD-10-CM | POA: Diagnosis not present

## 2021-02-24 DIAGNOSIS — Z20822 Contact with and (suspected) exposure to covid-19: Secondary | ICD-10-CM | POA: Diagnosis not present

## 2021-02-24 DIAGNOSIS — R059 Cough, unspecified: Secondary | ICD-10-CM | POA: Diagnosis present

## 2021-02-24 LAB — RESP PANEL BY RT-PCR (FLU A&B, COVID) ARPGX2
Influenza A by PCR: NEGATIVE
Influenza B by PCR: NEGATIVE
SARS Coronavirus 2 by RT PCR: NEGATIVE

## 2021-02-24 NOTE — Discharge Instructions (Signed)
Your COVID and flu testing was negative.  We obtained a chest x-ray monitor which was negative.  You likely have an upper respiratory tract infection caused by a viral illness. Please follow-up with your primary care provider. You can take over the counter robitussin or other decongestions to manage her symptoms.

## 2021-02-24 NOTE — ED Triage Notes (Signed)
Pt states that she has been coughing for the past 4 days, states that at times the "coughing fits take her breath"

## 2021-02-24 NOTE — ED Provider Notes (Signed)
[facility]  ____________________________________________   Event Date/Time   First MD Initiated Contact with Patient 02/24/21 1125     (approximate)  I have reviewed the triage vital signs and the nursing notes.   HISTORY  Chief Complaint Cough    HPI Jamie Todd is a 80 y.o. female with past medical history of allergies and chronic sinusitis who presents with cough.  Symptoms started about 1 week ago.  Patient endorses feeling significant congestion in her chest and throat area.  She has coughing fits but is unable to get any phlegm up.  During these episodes she feels short of breath and anxious.  She is has no dyspnea when she is not coughing.  Also endorses feeling stuffed up in her nose but does not have any any nasal discharge.  She denies fevers chills.  No nausea vomiting or abdominal pain.  Has had decreased appetite.  Denies chest pain.  No lower extremity swelling.  Patient to the home COVID test was negative.         Past Medical History:  Diagnosis Date   Basal cell carcinoma 04/23/2014   Right frontal scalp within hair line. Nodular. Excised 06/18/2014, margins free   Cat scratch fever    Chronic fatigue    Chronic migraine    as of 2020 resolved and no issues w/in the last 20 years per pt    Chronic sinusitis    Complication of anesthesia    slow to wake up   Depression    1975 to 1995 professional help sought   Endometriosis    GERD (gastroesophageal reflux disease)    Hemochromatosis    Hyperlipidemia    Measles    Miscarriage    Osteoporosis    Prediabetes    Pyloric stenosis    Skin cancer    basal and squamous cell    Squamous cell carcinoma of skin 07/19/2008   Right lat. lower leg. SCCis   Type A blood, Rh negative    Ulcerative colitis ([place])    1975 to 1995 tx'ed with medical care    UTI (urinary tract infection)     Patient Active Problem List   Diagnosis Date Noted   HLD (hyperlipidemia) 08/09/2018    Hypercalcemia 08/09/2018   Osteopenia 08/09/2018   Allergic rhinitis 08/09/2018   Chronic ulcerative colitis, unspecified complication ([place])    Idiopathic chronic inflammatory bowel disease    Chronic sinusitis 05/31/2017   Prediabetes 05/31/2017   Breast pain, right 05/31/2017   H/O nonmelanoma skin cancer 05/31/2017    Past Surgical History:  Procedure Laterality Date   CATARACT EXTRACTION     b/l   COLONOSCOPY WITH PROPOFOL N/A 08/25/2017   Procedure: COLONOSCOPY WITH PROPOFOL;  Surgeon: Lucilla Lame, MD;  Location: [place];  Service: Endoscopy;  Laterality: N/A;  wants to be last patient   OTHER SURGICAL HISTORY     laparoscopy several, D&Cs 1970s/1980s     Prior to Admission medications   Medication Sig Start Date End Date Taking? Authorizing Provider  acetaminophen (TYLENOL) 325 MG tablet Take 650 mg by mouth every 6 (six) hours as needed.    [provider]  acidophilus (RISAQUAD) CAPS capsule Take by mouth daily.    [provider]  aspirin-acetaminophen-caffeine (EXCEDRIN MIGRAINE) 785-144-1984 MG tablet Take by mouth every 6 (six) hours as needed for headache.    [provider]  Calcium-Magnesium-Vitamin D (CALCIUM 500 PO) Take by mouth.    [provider]  cetirizine-pseudoephedrine (ZYRTEC-D) 5-120 MG tablet Take 1 tablet by mouth daily.    [provider]  ciclopirox (LOPROX) 0.77 % SUSP Apply to affected nails as needed. 02/07/20   Moye, [place], MD  Cranberry 1000 MG CAPS Take by mouth.    [provider]  dextromethorphan-guaiFENesin (MUCINEX DM) 30-600 MG 12hr tablet Take 1 tablet by mouth 2 (two) times daily.    [provider]  doxycycline (PERIOSTAT) 20 MG tablet Take 1 tablet (20 mg total) by mouth 2 (two) times daily. Take with food 01/27/21 02/26/21  Moye, [place], MD  fluticasone Asencion Islam) 50 MCG/ACT nasal spray  04/15/17   [provider]  hydrocortisone 2.5 % cream Apply  topically 2 (two) times daily as needed (Rash). 01/27/21   Moye, [place], MD  ibuprofen (ADVIL,MOTRIN) 200 MG tablet Take 200 mg by mouth every 6 (six) hours as needed.    [provider]  ketoconazole (NIZORAL) 2 % cream Apply 1 application topically 2 (two) times daily. 01/27/21 03/10/21  Moye, [place], MD  MELATONIN PO Take 6 mg by mouth.    [provider]  metroNIDAZOLE (METROCREAM) 0.75 % cream APPLY 1-2 TIMES DAILY ON THE SKIN AS NEEDED FOR FLARES 07/16/20   Moye, [place], MD  montelukast (SINGULAIR) 10 MG tablet  03/07/17   [provider]  Multiple Vitamin (MULTIVITAMIN) tablet Take 1 tablet by mouth daily.    [provider]  Multiple Vitamins-Minerals (ALIVE WOMENS ENERGY PO) Take by mouth.    [provider]  Multiple Vitamins-Minerals (COMPLETE ENERGY PO) Take by mouth.    [provider]  pantoprazole (PROTONIX) 40 MG tablet Take 1 tablet (40 mg total) by mouth daily. 09/05/18 09/05/19  Earleen Newport, MD    Allergies Patient has no known allergies.  Family History  Problem Relation Age of Onset   Dementia Mother    Heart disease Mother    Deafness Mother    Blindness Mother    Diabetes Other        both sides of family     Social History Social History   Tobacco Use   Smoking status: Never   Smokeless tobacco: Never  Substance Use Topics   Alcohol use: Yes    Comment: ocass   Drug use: No    Review of Systems   Review of Systems  Constitutional:  Positive for appetite change. Negative for chills and fever.  HENT:  Positive for congestion and sore throat.   Respiratory:  Positive for cough and shortness of breath. Negative for chest tightness.   Cardiovascular:  Negative for chest pain, palpitations and leg swelling.  Gastrointestinal:  Negative for abdominal pain, nausea and vomiting.  All other systems reviewed and are negative.  Physical Exam Updated Vital Signs BP (!) 151/79 (BP Location: Left  Arm)   Pulse 95   Temp 98.5 F (36.9 C) (Oral)   Resp 16   Ht 5' (1.524 m)   Wt 54.4 kg   SpO2 96%   BMI 23.44 kg/m   Physical Exam Vitals and nursing note reviewed.  Constitutional:      General: She is not in acute distress.    Appearance: Normal appearance.  HENT:     Head: Normocephalic and atraumatic.     Mouth/Throat:     Comments: Mild posterior oropharyngeal erythema, no exudate Eyes:     General: No scleral icterus.    Conjunctiva/sclera: Conjunctivae normal.  Pulmonary:     Effort: Pulmonary  effort is normal. No respiratory distress.     Breath sounds: No stridor. No wheezing or rales.  Musculoskeletal:        General: No deformity or signs of injury.     Cervical back: Normal range of motion.  Skin:    General: Skin is dry.     Coloration: Skin is not jaundiced or pale.  Neurological:     General: No focal deficit present.     Mental Status: She is alert and oriented to person, place, and time. Mental status is at baseline.  Psychiatric:        Mood and Affect: Mood normal.        Behavior: Behavior normal.     LABS (all labs ordered are listed, but only abnormal results are displayed)  Labs Reviewed  RESP PANEL BY RT-PCR (FLU A&B, COVID) [id number]   ____________________________________________  EKG  N/a ____________________________________________  RADIOLOGY Almeta Monas, personally viewed and evaluated these images (plain radiographs) as part of my medical decision making, as well as reviewing the written report by the radiologist.  ED MD interpretation:  I reviewed the CXR which does not show any acute cardiopulmonary process      ____________________________________________   PROCEDURES  Procedure(s) performed (including Critical Care):  Procedures   ____________________________________________   INITIAL IMPRESSION / ASSESSMENT AND PLAN / ED COURSE     Patient is 80 year old female presents with primarily cough over the  last week.  She has significant upper chest congestion but cough is nonproductive.  She has coughing fits associated dyspnea.  However she has no dyspnea or chest pain when she is not coughing.  Vital signs within normal limits.  She is very well-appearing, no respiratory distress.  Her lungs are clear.  No signs of volume overload.  We will obtain a chest x-ray to rule out pneumonia however I suspect this is an upper respiratory infection.  We will also send COVID and flu testing.  Anticipate discharge.  X-ray is within normal limits.  Suspect viral URI.  COVID and influenza testing are negative.  Will discharge.      ____________________________________________   FINAL CLINICAL IMPRESSION(S) / ED DIAGNOSES  Final diagnoses:  Viral illness     ED Discharge Orders     None        Note:  This document was prepared using Dragon voice recognition software and may include unintentional dictation errors.    Rada Hay, MD 02/24/21 1352

## 2021-05-20 ENCOUNTER — Ambulatory Visit: Payer: Medicare Other | Admitting: Dermatology

## 2021-07-09 ENCOUNTER — Telehealth: Payer: Self-pay

## 2021-07-09 ENCOUNTER — Ambulatory Visit (INDEPENDENT_AMBULATORY_CARE_PROVIDER_SITE_OTHER): Payer: Medicare Other | Admitting: Internal Medicine

## 2021-07-09 ENCOUNTER — Encounter: Payer: Self-pay | Admitting: Internal Medicine

## 2021-07-09 ENCOUNTER — Other Ambulatory Visit: Payer: Self-pay

## 2021-07-09 VITALS — BP 122/80 | HR 94 | Temp 98.1°F | Ht 60.0 in | Wt 128.2 lb

## 2021-07-09 DIAGNOSIS — R778 Other specified abnormalities of plasma proteins: Secondary | ICD-10-CM

## 2021-07-09 DIAGNOSIS — R7303 Prediabetes: Secondary | ICD-10-CM

## 2021-07-09 DIAGNOSIS — M810 Age-related osteoporosis without current pathological fracture: Secondary | ICD-10-CM

## 2021-07-09 DIAGNOSIS — N289 Disorder of kidney and ureter, unspecified: Secondary | ICD-10-CM

## 2021-07-09 DIAGNOSIS — Z1231 Encounter for screening mammogram for malignant neoplasm of breast: Secondary | ICD-10-CM | POA: Diagnosis not present

## 2021-07-09 DIAGNOSIS — Z23 Encounter for immunization: Secondary | ICD-10-CM

## 2021-07-09 DIAGNOSIS — R7989 Other specified abnormal findings of blood chemistry: Secondary | ICD-10-CM

## 2021-07-09 DIAGNOSIS — E538 Deficiency of other specified B group vitamins: Secondary | ICD-10-CM

## 2021-07-09 DIAGNOSIS — Z1329 Encounter for screening for other suspected endocrine disorder: Secondary | ICD-10-CM | POA: Diagnosis not present

## 2021-07-09 DIAGNOSIS — Z0000 Encounter for general adult medical examination without abnormal findings: Secondary | ICD-10-CM

## 2021-07-09 DIAGNOSIS — Z6825 Body mass index (BMI) 25.0-25.9, adult: Secondary | ICD-10-CM

## 2021-07-09 DIAGNOSIS — E611 Iron deficiency: Secondary | ICD-10-CM

## 2021-07-09 DIAGNOSIS — N898 Other specified noninflammatory disorders of vagina: Secondary | ICD-10-CM

## 2021-07-09 DIAGNOSIS — Z01419 Encounter for gynecological examination (general) (routine) without abnormal findings: Secondary | ICD-10-CM

## 2021-07-09 DIAGNOSIS — R748 Abnormal levels of other serum enzymes: Secondary | ICD-10-CM

## 2021-07-09 DIAGNOSIS — E785 Hyperlipidemia, unspecified: Secondary | ICD-10-CM

## 2021-07-09 DIAGNOSIS — E559 Vitamin D deficiency, unspecified: Secondary | ICD-10-CM

## 2021-07-09 DIAGNOSIS — Z1389 Encounter for screening for other disorder: Secondary | ICD-10-CM | POA: Diagnosis not present

## 2021-07-09 DIAGNOSIS — R5383 Other fatigue: Secondary | ICD-10-CM

## 2021-07-09 DIAGNOSIS — Z1211 Encounter for screening for malignant neoplasm of colon: Secondary | ICD-10-CM

## 2021-07-09 NOTE — Progress Notes (Signed)
Chief Complaint  Patient presents with   Atrial Fibrillation   Annual Exam   annual 1. H/o EBV h/o fatigue worse x 1 year  2. Prediabetes wants referral to nutrition  3. Mammogram due  4. Wants gyn referral sisters friends dying of cancer   Review of Systems  Constitutional:  Negative for weight loss.  HENT:  Negative for hearing loss.   Eyes:  Negative for blurred vision.  Respiratory:  Negative for shortness of breath.   Cardiovascular:  Negative for chest pain.  Gastrointestinal:  Negative for abdominal pain and blood in stool.  Genitourinary:  Negative for dysuria.  Musculoskeletal:  Negative for falls and joint pain.  Skin:  Negative for rash.  Neurological:  Negative for headaches.  Psychiatric/Behavioral:  Negative for depression.   Past Medical History:  Diagnosis Date   Basal cell carcinoma 04/23/2014   Right frontal scalp within hair line. Nodular. Excised 06/18/2014, margins free   Cat scratch fever    Chronic fatigue    Chronic migraine    as of 2020 resolved and no issues w/in the last 20 years per pt    Chronic sinusitis    Complication of anesthesia    slow to wake up   COVID-19    04/2021   Depression    1975 to 1995 professional help sought   EBV infection    Endometriosis    GERD (gastroesophageal reflux disease)    Hemochromatosis    Hyperlipidemia    Measles    Miscarriage    Osteoporosis    Prediabetes    Pyloric stenosis    Skin cancer    basal and squamous cell    Squamous cell carcinoma of skin 07/19/2008   Right lat. lower leg. SCCis   Type A blood, Rh negative    Ulcerative colitis ([place])    1975 to 1995 tx'ed with medical care    UTI (urinary tract infection)    Past Surgical History:  Procedure Laterality Date   CATARACT EXTRACTION     b/l   COLONOSCOPY WITH PROPOFOL N/A 08/25/2017   Procedure: COLONOSCOPY WITH PROPOFOL;  Surgeon: Lucilla Lame, MD;  Location: [place];  Service: Endoscopy;  Laterality: N/A;  wants to be  last patient   OTHER SURGICAL HISTORY     laparoscopy several, D&Cs 1970s/1980s    Family History  Problem Relation Age of Onset   Dementia Mother    Heart disease Mother    Deafness Mother    Blindness Mother    Diabetes Other        both sides of family    Social History   Socioeconomic History   Marital status: Divorced    Spouse name: Not on file   Number of children: Not on file   Years of education: Not on file   Highest education level: Not on file  Occupational History   Not on file  Tobacco Use   Smoking status: Never   Smokeless tobacco: Never  Substance and Sexual Activity   Alcohol use: Yes    Comment: ocass   Drug use: No   Sexual activity: Never  Other Topics Concern   Not on file  Social History Narrative   Grad school    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   acidophilus (RISAQUAD) CAPS capsule Take by mouth daily.   aspirin-acetaminophen-caffeine (EXCEDRIN MIGRAINE) 250-250-65 MG tablet Take by mouth every 6 (six) hours as needed for headache.   Calcium-Magnesium-Vitamin D (CALCIUM 500 PO) Take by mouth.   cetirizine-pseudoephedrine (ZYRTEC-D) 5-120 MG tablet Take 1 tablet by mouth daily.   ciclopirox (LOPROX) 0.77 % SUSP Apply to affected nails as needed.   Cranberry 1000 MG CAPS Take by mouth.   dextromethorphan-guaiFENesin (MUCINEX DM) 30-600 MG 12hr tablet Take 1  tablet by mouth 2 (two) times daily.   fluticasone (FLONASE) 50 MCG/ACT nasal spray    hydrocortisone 2.5 % cream Apply topically 2 (two) times daily as needed (Rash).   ibuprofen (ADVIL,MOTRIN) 200 MG tablet Take 200 mg by mouth every 6 (six) hours as needed.   MELATONIN PO Take 6 mg by mouth.   metroNIDAZOLE (METROCREAM) 0.75 % cream APPLY 1-2 TIMES DAILY ON THE SKIN AS NEEDED FOR FLARES   montelukast (SINGULAIR) 10 MG tablet    Multiple Vitamin (MULTIVITAMIN) tablet Take 1 tablet by mouth daily.   Multiple Vitamins-Minerals (ALIVE WOMENS ENERGY PO) Take by mouth.   Multiple Vitamins-Minerals (COMPLETE ENERGY PO) Take by mouth.   No Known Allergies No results found for this or any previous visit (from the past 2160 hour(s)). Objective  Body mass index is 25.04 kg/m. Wt Readings from Last 3 Encounters:  07/09/21 128 lb 3.2 oz (58.2 kg)  02/24/21 120 lb (54.4 kg)  06/08/19 125 lb (56.7 kg)   Temp Readings from Last 3 Encounters:  07/09/21 98.1 F (36.7 C) (Oral)  02/24/21 98.5 F (36.9 C) (Oral)  09/05/18 97.8 F (36.6 C) (Oral)   BP Readings from Last 3 Encounters:  07/09/21 122/80  02/24/21 (!) 151/79  09/05/18 134/69   Pulse Readings from Last 3 Encounters:  07/09/21 94  02/24/21 95  09/05/18 98    Physical Exam Vitals and nursing note reviewed.  Constitutional:      Appearance: Normal appearance. She is well-developed and well-groomed.  HENT:     Head: Normocephalic and atraumatic.  Eyes:     Conjunctiva/sclera: Conjunctivae normal.     Pupils: Pupils are equal, round, and reactive to light.  Cardiovascular:     Rate and Rhythm: Normal rate and regular rhythm.     Heart sounds: Normal heart sounds. No murmur heard. Pulmonary:     Effort: Pulmonary effort is normal.     Breath sounds: Normal breath sounds.  Abdominal:     General: Abdomen is flat. Bowel sounds are normal.     Tenderness: There is no abdominal tenderness.  Musculoskeletal:        General:  No tenderness.  Skin:    General: Skin is warm and dry.  Neurological:     General: No focal deficit present.     Mental Status: She is alert and oriented to person, place, and time. Mental status is at baseline.     Cranial Nerves: Cranial nerves 2-12 are intact.  Gait: Gait is intact.  Psychiatric:        Attention and Perception: Attention and perception normal.        Mood and Affect: Mood and affect normal.        Speech: Speech normal.        Behavior: Behavior normal. Behavior is cooperative.        Thought Content: Thought content normal.        Cognition and Memory: Cognition and memory normal.        Judgment: Judgment normal.    Assessment  Plan  Annual physical exam - Plan: EPSTEIN-BARR VIRUS (EBV) Antibody Profile, Comprehensive metabolic panel, Lipid panel, CBC w/Diff, TSH See below    Visit for pelvic exam - Plan: Ambulatory referral to Obstetrics / Gynecology  Prediabetes - Plan: Amb ref to Medical Nutrition Therapy-MNT, Hemoglobin A1c  Hyperlipidemia, unspecified hyperlipidemia type - Plan: Amb ref to Medical Nutrition Therapy-MNT, Lipid panel  Thyroid disorder screening - Plan: TSH  Screening for blood or protein in urine - Plan: Urinalysis, Routine w reflex microscopic  Iron deficiency - Plan: IBC + Ferritin  Vitamin D deficiency - Plan: Vitamin D (25 hydroxy)  B12 deficiency - Plan: Vitamin B12  Osteoporosis, unspecified osteoporosis type, unspecified pathological fracture presence - Plan: DG Bone Density Agreeble to prolia   Screening for colon cancer - Plan: Ambulatory referral to Gastroenterology  Fatigue, unspecified type - Plan: CBC w/Diff, TSH, IBC + Ferritin   HM sch fasting labs  Flu shot utd Tdap had 2017/2018 Given today prevnar consider in future pna 23 vaccines  shingrix had 09/07/16 and 02/23/17 [place]  covid vx utd   Pap had 2015  mammo 07/24/2018 normal and had [place] right breast pain negative  -referred for mammo 07/2019    Colonoscopy 08/25/17 diverticulosis due Dr. Allen Norris   DEXA 07/19/2018 T score -2.3 with high FRAX score see above prolia pending approval  -rec D3 1000 IU daily  A1C prediabetes  --pt wanted referral for nutrition counseling never went due to pandemic  [facility]  Provider: Dr. Olivia Mackie McLean-Scocuzza-Internal Medicine

## 2021-07-09 NOTE — Telephone Encounter (Signed)
Called no answer no voicemail ?

## 2021-07-09 NOTE — Patient Instructions (Addendum)
Dr. Leafy Ro gyn doctor ?Phone Fax E-mail Address  ?[phone] Not available [address]  ? [address] [place] [address]  ?   ?Specialties     ?Obstetrics and Gynecology     ? ? ?Pneumococcal Conjugate Vaccine (Prevnar 13) Suspension for Injection ?What is this medication? ?PNEUMOCOCCAL VACCINE (NEU mo KOK al vak SEEN) is a vaccine used to prevent pneumococcus bacterial infections. These bacteria can cause serious infections like pneumonia, meningitis, and blood infections. This vaccine will lower your chance of getting pneumonia. If you do get pneumonia, it can make your symptoms milder and your illness shorter. This vaccine will not treat an infection and will not cause infection. This vaccine is recommended for infants and young children, adults with certain medical conditions, and adults 61 years or older. ?This medicine may be used for other purposes; ask your health care provider or pharmacist if you have questions. ?COMMON BRAND NAME(S): Prevnar, Prevnar 13 ?What should I tell my care team before I take this medication? ?They need to know if you have any of these conditions: ?bleeding problems ?fever ?immune system problems ?an unusual or allergic reaction to pneumococcal vaccine, diphtheria toxoid, other vaccines, latex, other medicines, foods, dyes, or preservatives ?pregnant or trying to get pregnant ?breast-feeding ?How should I use this medication? ?This vaccine is for injection into a muscle. It is given by a health care professional. ?A copy of Vaccine Information Statements will be given before each vaccination. Read this sheet carefully each time. The sheet may change frequently. ?Talk to your pediatrician regarding the use of this medicine in children. While this drug may be prescribed for children as young as 33 weeks old for selected conditions, precautions do apply. ?Overdosage: If you think you have taken too much of this medicine contact a poison control center or emergency room at  once. ?NOTE: This medicine is only for you. Do not share this medicine with others. ?What if I miss a dose? ?It is important not to miss your dose. Call your doctor or health care professional if you are unable to keep an appointment. ?What may interact with this medication? ?medicines for cancer chemotherapy ?medicines that suppress your immune function ?steroid medicines like prednisone or cortisone ?This list may not describe all possible interactions. Give your health care provider a list of all the medicines, herbs, non-prescription drugs, or dietary supplements you use. Also tell them if you smoke, drink alcohol, or use illegal drugs. Some items may interact with your medicine. ?What should I watch for while using this medication? ?Mild fever and pain should go away in 3 days or less. Report any unusual symptoms to your doctor or health care professional. ?What side effects may I notice from receiving this medication? ?Side effects that you should report to your doctor or health care professional as soon as possible: ?allergic reactions like skin rash, itching or hives, swelling of the face, lips, or tongue ?breathing problems ?confused ?fast or irregular heartbeat ?fever over 102 degrees F ?seizures ?unusual bleeding or bruising ?unusual muscle weakness ?Side effects that usually do not require medical attention (report to your doctor or health care professional if they continue or are bothersome): ?aches and pains ?diarrhea ?fever of 102 degrees F or less ?headache ?irritable ?loss of appetite ?pain, tender at site where injected ?trouble sleeping ?This list may not describe all possible side effects. Call your doctor for medical advice about side effects. You may report side effects to FDA at 1-800-FDA-1088. ?Where should I keep  my medication? ?This does not apply. This vaccine is given in a clinic, pharmacy, doctor's office, or other health care setting and will not be stored at home. ?NOTE: This sheet is  a summary. It may not cover all possible information. If you have questions about this medicine, talk to your doctor, pharmacist, or health care provider. ?? 2022 Elsevier/Gold Standard (2014-01-31 00:00:00) ? ?

## 2021-07-10 ENCOUNTER — Telehealth: Payer: Self-pay

## 2021-07-10 NOTE — Telephone Encounter (Signed)
CALLED PATIENT NO ANSWER no voicemail ?Letter sent ?

## 2021-07-13 DIAGNOSIS — Z961 Presence of intraocular lens: Secondary | ICD-10-CM | POA: Diagnosis not present

## 2021-07-14 ENCOUNTER — Other Ambulatory Visit: Payer: Self-pay

## 2021-07-14 ENCOUNTER — Other Ambulatory Visit (INDEPENDENT_AMBULATORY_CARE_PROVIDER_SITE_OTHER): Payer: Medicare Other

## 2021-07-14 DIAGNOSIS — E611 Iron deficiency: Secondary | ICD-10-CM | POA: Diagnosis not present

## 2021-07-14 DIAGNOSIS — Z1329 Encounter for screening for other suspected endocrine disorder: Secondary | ICD-10-CM

## 2021-07-14 DIAGNOSIS — E559 Vitamin D deficiency, unspecified: Secondary | ICD-10-CM | POA: Diagnosis not present

## 2021-07-14 DIAGNOSIS — E538 Deficiency of other specified B group vitamins: Secondary | ICD-10-CM | POA: Diagnosis not present

## 2021-07-14 DIAGNOSIS — E785 Hyperlipidemia, unspecified: Secondary | ICD-10-CM

## 2021-07-14 DIAGNOSIS — R5383 Other fatigue: Secondary | ICD-10-CM

## 2021-07-14 DIAGNOSIS — Z0000 Encounter for general adult medical examination without abnormal findings: Secondary | ICD-10-CM

## 2021-07-14 DIAGNOSIS — Z1389 Encounter for screening for other disorder: Secondary | ICD-10-CM | POA: Diagnosis not present

## 2021-07-14 DIAGNOSIS — R7303 Prediabetes: Secondary | ICD-10-CM

## 2021-07-14 LAB — COMPREHENSIVE METABOLIC PANEL
ALT: 22 U/L (ref 0–35)
AST: 25 U/L (ref 0–37)
Albumin: 4.5 g/dL (ref 3.5–5.2)
Alkaline Phosphatase: 133 U/L — ABNORMAL HIGH (ref 39–117)
BUN: 27 mg/dL — ABNORMAL HIGH (ref 6–23)
CO2: 28 mEq/L (ref 19–32)
Calcium: 9.8 mg/dL (ref 8.4–10.5)
Chloride: 102 mEq/L (ref 96–112)
Creatinine, Ser: 0.91 mg/dL (ref 0.40–1.20)
GFR: 60.51 mL/min (ref >60.00)
Glucose, Bld: 103 mg/dL — ABNORMAL HIGH (ref 70–99)
Potassium: 4.8 mEq/L (ref 3.5–5.1)
Sodium: 137 mEq/L (ref 135–145)
Total Bilirubin: 0.3 mg/dL (ref 0.2–1.2)
Total Protein: 7.6 g/dL (ref 6.0–8.3)

## 2021-07-14 LAB — IBC + FERRITIN
Ferritin: 34.7 ng/mL (ref 10.0–291.0)
Iron: 76 ug/dL (ref 42–145)
Saturation Ratios: 18 % — ABNORMAL LOW (ref 20.0–50.0)
TIBC: 421.4 ug/dL (ref 250.0–450.0)
Transferrin: 301 mg/dL (ref 212.0–360.0)

## 2021-07-14 LAB — VITAMIN B12: Vitamin B-12: 1127 pg/mL — ABNORMAL HIGH (ref 211–911)

## 2021-07-14 LAB — CBC WITH DIFFERENTIAL/PLATELET
Basophils Absolute: 0 10*3/uL (ref 0.0–0.1)
Basophils Relative: 0.3 % (ref 0.0–3.0)
Eosinophils Absolute: 0.1 10*3/uL (ref 0.0–0.7)
Eosinophils Relative: 3 % (ref 0.0–5.0)
HCT: 38.8 % (ref 36.0–46.0)
Hemoglobin: 12.8 g/dL (ref 12.0–15.0)
Lymphocytes Relative: 30.8 % (ref 12.0–46.0)
Lymphs Abs: 1.5 10*3/uL (ref 0.7–4.0)
MCHC: 33.1 g/dL (ref 30.0–36.0)
MCV: 91.3 fl (ref 78.0–100.0)
Monocytes Absolute: 0.4 10*3/uL (ref 0.1–1.0)
Monocytes Relative: 8.7 % (ref 3.0–12.0)
Neutro Abs: 2.8 10*3/uL (ref 1.4–7.7)
Neutrophils Relative %: 57.2 % (ref 43.0–77.0)
Platelets: 295 10*3/uL (ref 150.0–400.0)
RBC: 4.25 Mil/uL (ref 3.87–5.11)
RDW: 14.1 % (ref 11.5–15.5)
WBC: 4.8 10*3/uL (ref 4.0–10.5)

## 2021-07-14 LAB — LIPID PANEL
Cholesterol: 249 mg/dL — ABNORMAL HIGH (ref 0–200)
HDL: 69.5 mg/dL (ref >39.00)
LDL Cholesterol: 155 mg/dL — ABNORMAL HIGH (ref 0–99)
NonHDL: 179.03
Total CHOL/HDL Ratio: 4
Triglycerides: 121 mg/dL (ref 0.0–149.0)
VLDL: 24.2 mg/dL (ref 0.0–40.0)

## 2021-07-14 LAB — VITAMIN D 25 HYDROXY (VIT D DEFICIENCY, FRACTURES): VITD: 59.31 ng/mL (ref 30.00–100.00)

## 2021-07-14 LAB — HEMOGLOBIN A1C: Hgb A1c MFr Bld: 6.2 % (ref 4.6–6.5)

## 2021-07-14 LAB — TSH: TSH: 3.92 u[IU]/mL (ref 0.35–5.50)

## 2021-07-14 NOTE — Addendum Note (Signed)
Addended by: Orland Mustard on: 07/14/2021 04:24 PM ? ? Modules accepted: Orders ? ?

## 2021-07-15 LAB — URINALYSIS, ROUTINE W REFLEX MICROSCOPIC
Bilirubin Urine: NEGATIVE
Glucose, UA: NEGATIVE
Hgb urine dipstick: NEGATIVE
Ketones, ur: NEGATIVE
Leukocytes,Ua: NEGATIVE
Nitrite: NEGATIVE
Protein, ur: NEGATIVE
Specific Gravity, Urine: 1.013 (ref 1.001–1.035)
pH: 5.5 (ref 5.0–8.0)

## 2021-07-17 LAB — EPSTEIN-BARR VIRUS (EBV) ANTIBODY PROFILE
EBV NA IgG: 62.3 U/mL — ABNORMAL HIGH (ref 0.0–17.9)
EBV VCA IgG: 274 U/mL — ABNORMAL HIGH (ref 0.0–17.9)
EBV VCA IgM: <36 U/mL (ref 0.0–35.9)

## 2021-07-20 ENCOUNTER — Other Ambulatory Visit: Payer: Self-pay

## 2021-07-20 DIAGNOSIS — E785 Hyperlipidemia, unspecified: Secondary | ICD-10-CM

## 2021-07-20 MED ORDER — PRAVASTATIN SODIUM 10 MG PO TABS: 10.0000 mg | ORAL_TABLET | Freq: Every day | ORAL | 1 refills | Status: DC

## 2021-07-23 ENCOUNTER — Other Ambulatory Visit (INDEPENDENT_AMBULATORY_CARE_PROVIDER_SITE_OTHER): Payer: Medicare Other

## 2021-07-23 ENCOUNTER — Other Ambulatory Visit: Payer: Self-pay

## 2021-07-23 DIAGNOSIS — R748 Abnormal levels of other serum enzymes: Secondary | ICD-10-CM | POA: Diagnosis not present

## 2021-07-23 DIAGNOSIS — N289 Disorder of kidney and ureter, unspecified: Secondary | ICD-10-CM | POA: Diagnosis not present

## 2021-07-24 LAB — PROTEIN ELECTROPHORESIS, URINE REFLEX
Albumin ELP, Urine: 19.4 %
Alpha-1-Globulin, U: 13.2 %
Alpha-2-Globulin, U: 22.8 %
Beta Globulin, U: 26.1 %
Gamma Globulin, U: 18.5 %
Protein, Ur: 6.4 mg/dL

## 2021-07-27 LAB — PROTEIN ELECTROPHORESIS, SERUM
A/G Ratio: 1.2 (ref 0.7–1.7)
Albumin ELP: 4.2 g/dL (ref 2.9–4.4)
Alpha 1: 0.2 g/dL (ref 0.0–0.4)
Alpha 2: 0.8 g/dL (ref 0.4–1.0)
Beta: 1 g/dL (ref 0.7–1.3)
Gamma Globulin: 1.4 g/dL (ref 0.4–1.8)
Globulin, Total: 3.4 g/dL (ref 2.2–3.9)
M-Spike, %: 1 g/dL — ABNORMAL HIGH
Total Protein: 7.6 g/dL (ref 6.0–8.5)

## 2021-07-27 LAB — GAMMA GT: GGT: 51 IU/L (ref 0–60)

## 2021-07-28 LAB — ALKALINE PHOSPHATASE, ISOENZYMES

## 2021-07-28 LAB — SPECIMEN STATUS REPORT

## 2021-07-31 ENCOUNTER — Encounter: Payer: Self-pay | Admitting: Internal Medicine

## 2021-07-31 LAB — ALKALINE PHOSPHATASE, ISOENZYMES
Alkaline Phosphatase: 143 IU/L — ABNORMAL HIGH (ref 44–121)
BONE FRACTION: 14 % (ref 14–68)
INTESTINAL FRAC.: 0 % (ref 0–18)
LIVER FRACTION: 86 % — ABNORMAL HIGH (ref 18–85)

## 2021-07-31 LAB — SPECIMEN STATUS REPORT

## 2021-07-31 NOTE — Addendum Note (Signed)
Addended by: Orland Mustard on: 07/31/2021 10:43 AM ? ? Modules accepted: Orders ? ?

## 2021-08-04 ENCOUNTER — Other Ambulatory Visit: Payer: Self-pay

## 2021-08-04 ENCOUNTER — Ambulatory Visit
Admission: RE | Admit: 2021-08-04 | Discharge: 2021-08-04 | Disposition: A | Discharge status: Home / Self Care | Payer: Medicare Other | Source: Ambulatory Visit | Attending: Internal Medicine | Admitting: Internal Medicine

## 2021-08-04 DIAGNOSIS — N281 Cyst of kidney, acquired: Secondary | ICD-10-CM | POA: Diagnosis not present

## 2021-08-04 DIAGNOSIS — R7989 Other specified abnormal findings of blood chemistry: Secondary | ICD-10-CM

## 2021-08-04 DIAGNOSIS — K760 Fatty (change of) liver, not elsewhere classified: Secondary | ICD-10-CM | POA: Diagnosis not present

## 2021-08-04 DIAGNOSIS — R748 Abnormal levels of other serum enzymes: Secondary | ICD-10-CM

## 2021-08-05 ENCOUNTER — Encounter: Payer: Self-pay | Admitting: Internal Medicine

## 2021-08-05 DIAGNOSIS — N281 Cyst of kidney, acquired: Secondary | ICD-10-CM | POA: Insufficient documentation

## 2021-08-05 DIAGNOSIS — K760 Fatty (change of) liver, not elsewhere classified: Secondary | ICD-10-CM | POA: Insufficient documentation

## 2021-08-05 HISTORY — DX: Cyst of kidney, acquired: N28.1

## 2021-08-11 DIAGNOSIS — Z124 Encounter for screening for malignant neoplasm of cervix: Secondary | ICD-10-CM | POA: Diagnosis not present

## 2021-08-14 ENCOUNTER — Inpatient Hospital Stay: Payer: Medicare Other

## 2021-08-14 ENCOUNTER — Encounter: Payer: Self-pay | Admitting: Oncology

## 2021-08-14 ENCOUNTER — Inpatient Hospital Stay: Payer: Medicare Other | Attending: Oncology | Admitting: Oncology

## 2021-08-14 VITALS — BP 134/80 | HR 99 | Temp 99.1°F | Resp 16 | Ht 60.0 in | Wt 128.6 lb

## 2021-08-14 DIAGNOSIS — R5383 Other fatigue: Secondary | ICD-10-CM | POA: Insufficient documentation

## 2021-08-14 DIAGNOSIS — D472 Monoclonal gammopathy: Secondary | ICD-10-CM | POA: Diagnosis not present

## 2021-08-14 DIAGNOSIS — Z85828 Personal history of other malignant neoplasm of skin: Secondary | ICD-10-CM | POA: Insufficient documentation

## 2021-08-14 DIAGNOSIS — R778 Other specified abnormalities of plasma proteins: Secondary | ICD-10-CM | POA: Insufficient documentation

## 2021-08-14 DIAGNOSIS — Z79899 Other long term (current) drug therapy: Secondary | ICD-10-CM | POA: Diagnosis not present

## 2021-08-14 LAB — COMPREHENSIVE METABOLIC PANEL
ALT: 28 U/L (ref 0–44)
AST: 26 U/L (ref 15–41)
Albumin: 4.7 g/dL (ref 3.5–5.0)
Alkaline Phosphatase: 120 U/L (ref 38–126)
Anion gap: 8 (ref 5–15)
BUN: 27 mg/dL — ABNORMAL HIGH (ref 8–23)
CO2: 27 mmol/L (ref 22–32)
Calcium: 9.7 mg/dL (ref 8.9–10.3)
Chloride: 99 mmol/L (ref 98–111)
Creatinine, Ser: 0.78 mg/dL (ref 0.44–1.00)
GFR, Estimated: >60 mL/min (ref >60)
Glucose, Bld: 99 mg/dL (ref 70–99)
Potassium: 4 mmol/L (ref 3.5–5.1)
Sodium: 134 mmol/L — ABNORMAL LOW (ref 135–145)
Total Bilirubin: 0.3 mg/dL (ref 0.3–1.2)
Total Protein: 8.4 g/dL — ABNORMAL HIGH (ref 6.5–8.1)

## 2021-08-14 LAB — CBC WITH DIFFERENTIAL/PLATELET
Abs Immature Granulocytes: 0.02 10*3/uL (ref 0.00–0.07)
Basophils Absolute: 0 10*3/uL (ref 0.0–0.1)
Basophils Relative: 0 %
Eosinophils Absolute: 0 10*3/uL (ref 0.0–0.5)
Eosinophils Relative: 0 %
HCT: 41.2 % (ref 36.0–46.0)
Hemoglobin: 13.5 g/dL (ref 12.0–15.0)
Immature Granulocytes: 0 %
Lymphocytes Relative: 20 %
Lymphs Abs: 1.4 10*3/uL (ref 0.7–4.0)
MCH: 29.8 pg (ref 26.0–34.0)
MCHC: 32.8 g/dL (ref 30.0–36.0)
MCV: 90.9 fL (ref 80.0–100.0)
Monocytes Absolute: 0.4 10*3/uL (ref 0.1–1.0)
Monocytes Relative: 6 %
Neutro Abs: 5.2 10*3/uL (ref 1.7–7.7)
Neutrophils Relative %: 74 %
Platelets: 303 10*3/uL (ref 150–400)
RBC: 4.53 MIL/uL (ref 3.87–5.11)
RDW: 13.4 % (ref 11.5–15.5)
WBC: 7.1 10*3/uL (ref 4.0–10.5)
nRBC: 0 % (ref 0.0–0.2)

## 2021-08-14 NOTE — Progress Notes (Signed)
? ?Hematology/Oncology Consult note ?[place] ?[phone]) [phone] [phone] ? ?Patient Care Team: ?McLean-Scocuzza, Nino Glow, MD as PCP - General (Internal Medicine) ?Sindy Guadeloupe, MD as Consulting Physician (Oncology)  ? ?Name of the patient: Jamie Todd  ?[id number]  ?1940-10-18  ? ? ?Reason for referral-abnormal SPEP ?  ?Referring physician-Dr. Terese Door ? ?Date of visit: 08/14/21 ? ? ?History of presenting illness-patient is a 81 year old female with a past medical history significant for hyperlipidemia ulcerative colitis who has been referred for abnormal SPEPHer most recent urine protein electrophoresis did not show any evidence of M protein however there was a spike of 1 g noted on her myeloma panel.  It was reported that this may be a monoclonal protein but could be seen by circulating immune complexes, cryoglobulins or hemolysis.  Alkaline phosphatase mildly elevated at 133.  CBC was normal with an H&H of 12.8/38.8.  CMP showed a creatinine of 0.9.  Total protein normal at 7.6 calcium level normal at 9.8.  B12 levels were elevated.  Ferritin levels were34 with an iron saturation of 18% and TIBC of 421. ? ?Patient reports having significant fatigue throughout the day despite not doing much work.  Her appetite is not as good as before but she has gained weight.  States that she has not had any issues with her inflammatory bowel disease in a long time now. ? ?ECOG PS- 1 ? ?Pain scale- 2 ? ? ?Review of systems- Review of Systems  ?Constitutional:  Positive for malaise/fatigue. Negative for chills, fever and weight loss.  ?HENT:  Negative for congestion, ear discharge and nosebleeds.   ?Eyes:  Negative for blurred vision.  ?Respiratory:  Negative for cough, hemoptysis, sputum production, shortness of breath and wheezing.   ?Cardiovascular:  Negative for chest pain, palpitations, orthopnea and claudication.  ?Gastrointestinal:  Negative for abdominal pain, blood in  stool, constipation, diarrhea, heartburn, melena, nausea and vomiting.  ?Genitourinary:  Negative for dysuria, flank pain, frequency, hematuria and urgency.  ?Musculoskeletal:  Negative for back pain, joint pain and myalgias.  ?Skin:  Negative for rash.  ?Neurological:  Negative for dizziness, tingling, focal weakness, seizures, weakness and headaches.  ?Endo/Heme/Allergies:  Does not bruise/bleed easily.  ?Psychiatric/Behavioral:  Negative for depression and suicidal ideas. The patient does not have insomnia.   ? ?No Known Allergies ? ?Patient Active Problem List  ? Diagnosis Date Noted  ? Fatty liver 08/05/2021  ? Kidney cysts 08/05/2021  ? HLD (hyperlipidemia) 08/09/2018  ? Hypercalcemia 08/09/2018  ? Osteopenia 08/09/2018  ? Allergic rhinitis 08/09/2018  ? Chronic ulcerative colitis, unspecified complication ([place])   ? Idiopathic chronic inflammatory bowel disease   ? Chronic sinusitis 05/31/2017  ? Prediabetes 05/31/2017  ? Breast pain, right 05/31/2017  ? H/O nonmelanoma skin cancer 05/31/2017  ? ? ? ?Past Medical History:  ?Diagnosis Date  ? Basal cell carcinoma 04/23/2014  ? Right frontal scalp within hair line. Nodular. Excised 06/18/2014, margins free  ? Cat scratch fever   ? Chronic fatigue   ? Chronic migraine   ? as of 2020 resolved and no issues w/in the last 20 years per pt   ? Chronic sinusitis   ? Complication of anesthesia   ? slow to wake up  ? COVID-19   ? 04/2021  ? Depression   ? 1975 to 1995 professional help sought  ? EBV infection   ? Endometriosis   ? GERD (gastroesophageal reflux disease)   ? Hemochromatosis   ? Hyperlipidemia   ?  Measles   ? Miscarriage   ? Osteoporosis   ? Prediabetes   ? Pyloric stenosis   ? Skin cancer   ? basal and squamous cell   ? Squamous cell carcinoma of skin 07/19/2008  ? Right lat. lower leg. SCCis  ? Type A blood, Rh negative   ? Ulcerative colitis ([place])   ? 1975 to 1995 tx'ed with medical care   ? UTI (urinary tract infection)   ? ? ? ?Past Surgical History:   ?Procedure Laterality Date  ? CATARACT EXTRACTION    ? b/l  ? COLONOSCOPY WITH PROPOFOL N/A 08/25/2017  ? Procedure: COLONOSCOPY WITH PROPOFOL;  Surgeon: Lucilla Lame, MD;  Location: [facility];  Service: Endoscopy;  Laterality: N/A;  wants to be last patient  ? OTHER SURGICAL HISTORY    ? laparoscopy several, D&Cs 1970s/1980s   ? ? ?Social History  ? ?Socioeconomic History  ? Marital status: Divorced  ?  Spouse name: Not on file  ? Number of children: Not on file  ? Years of education: Not on file  ? Highest education level: Not on file  ?Occupational History  ? Not on file  ?Tobacco Use  ? Smoking status: Never  ? Smokeless tobacco: Never  ?Substance and Sexual Activity  ? Alcohol use: Yes  ?  Comment: ocass  ? Drug use: No  ? Sexual activity: Never  ?Other Topics Concern  ? Not on file  ?Social History Narrative  ? Grad school   ? 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 She likes animals and has therapy dogs   ? From [facility]  ? Former Dietitian in 2017   ? ?Social Determinants of Health  ? ?Financial Resource Strain: Not on file  ?Food Insecurity: Not on file  ?Transportation Needs: Not on file  ?Physical Activity: Not on file  ?Stress: Not on file  ?Social Connections: Not on file  ?Intimate Partner Violence: Not on file  ? ?  ?Family History  ?Problem Relation Age of Onset  ? Dementia Mother   ? Heart disease Mother   ?     died at 92  ? Deafness Mother   ? Blindness Mother   ? Diabetes Other   ?     both sides of family   ? ? ? ?Current Outpatient Medications:  ?  acidophilus (RISAQUAD) CAPS capsule, Take by mouth daily., Disp: , Rfl:  ?  Calcium-Magnesium-Vitamin D (CALCIUM 500 PO), Take by mouth., Disp: , Rfl:  ?  cetirizine-pseudoephedrine (ZYRTEC-D) 5-120 MG tablet, Take 1 tablet by mouth daily., Disp: , Rfl:  ?  ciclopirox (LOPROX) 0.77 % SUSP, Apply to affected nails as needed., Disp:  30 mL, Rfl: 3 ?  Cranberry 1000 MG CAPS, Take by mouth., Disp: , Rfl:  ?  dextromethorphan-guaiFENesin (MUCINEX DM) 30-600 MG 12hr tablet, Take 1 tablet by mouth 2 (two) times daily., Disp: , Rfl:  ?  fluticasone (FLONASE) 50 MCG/ACT nasal spray, , Disp: , Rfl: 0 ?  ibuprofen (ADVIL,MOTRIN) 200 MG tablet, Take 200 mg by mouth every 6 (six) hours as needed., Disp: , Rfl:  ?  MELATONIN PO, Take 6 mg by mouth., Disp: , Rfl:  ?  montelukast (SINGULAIR) 10 MG tablet, , Disp: , Rfl: 0 ?  Multiple Vitamin (MULTIVITAMIN) tablet, Take 1 tablet by mouth daily., Disp: , Rfl:  ?  Multiple Vitamins-Minerals (ALIVE WOMENS ENERGY PO), Take by mouth., Disp: , Rfl:  ?  Multiple Vitamins-Minerals (COMPLETE ENERGY PO), Take by mouth., Disp: , Rfl:  ?  acetaminophen (TYLENOL) 325 MG tablet, Take 650 mg by mouth every 6 (six) hours as needed. (Patient not taking: Reported on 08/14/2021), Disp: , Rfl:  ?  aspirin-acetaminophen-caffeine (EXCEDRIN MIGRAINE) 250-250-65 MG tablet, Take by mouth every 6 (six) hours as needed for headache. (Patient not taking: Reported on 08/14/2021), Disp: , Rfl:  ?  hydrocortisone 2.5 % cream, Apply topically 2 (two) times daily as needed (Rash)., Disp: 28 g, Rfl: 2 ?  metroNIDAZOLE (METROCREAM) 0.75 % cream, APPLY 1-2 TIMES DAILY ON THE SKIN AS NEEDED FOR FLARES, Disp: 45 g, Rfl: 3 ?  pantoprazole (PROTONIX) 40 MG tablet, Take 1 tablet (40 mg total) by mouth daily. (Patient not taking: Reported on 08/14/2021), Disp: 30 tablet, Rfl: 1 ?  pravastatin (PRAVACHOL) 10 MG tablet, Take 1 tablet (10 mg total) by mouth daily. (Patient not taking: Reported on 08/14/2021), Disp: 90 tablet, Rfl: 1 ? ? ?Physical exam:  ?Vitals:  ? 08/14/21 1358  ?BP: 134/80  ?Pulse: 99  ?Resp: 16  ?Temp: 99.1 ?F (37.3 ?C)  ?SpO2: 98%  ?Weight: 128 lb 9.6 oz (58.3 kg)  ?Height: 5' (1.524 m)  ? ?Physical Exam ?Constitutional:   ?   General: She is not in acute distress. ?Cardiovascular:  ?   Rate and Rhythm: Normal rate and regular rhythm.  ?    Heart sounds: Normal heart sounds.  ?Pulmonary:  ?   Effort: Pulmonary effort is normal.  ?   Breath sounds: Normal breath sounds.  ?Abdominal:  ?   General: Bowel sounds are normal.  ?   Palpations: Abdom

## 2021-08-16 IMAGING — US US THYROID
1 series · 14 of 25 positions shown · non-contrast
Comparison: None.

CLINICAL DATA: Other.  Elevated TSH.

EXAM:
THYROID ULTRASOUND
TECHNIQUE: Ultrasound examination of the thyroid gland and adjacent soft
tissues was performed.

[Series 1: us thyroid · 0.06mm/px · 14 of 54 slices shown]
[im 1/54]
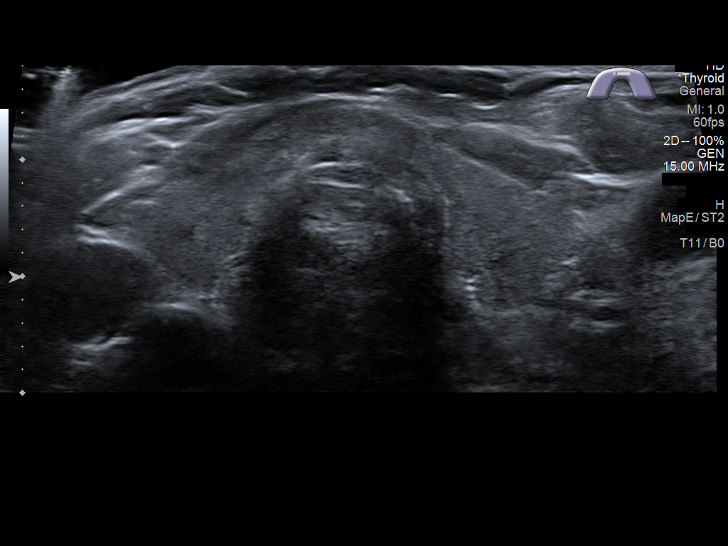
[im 5/54]
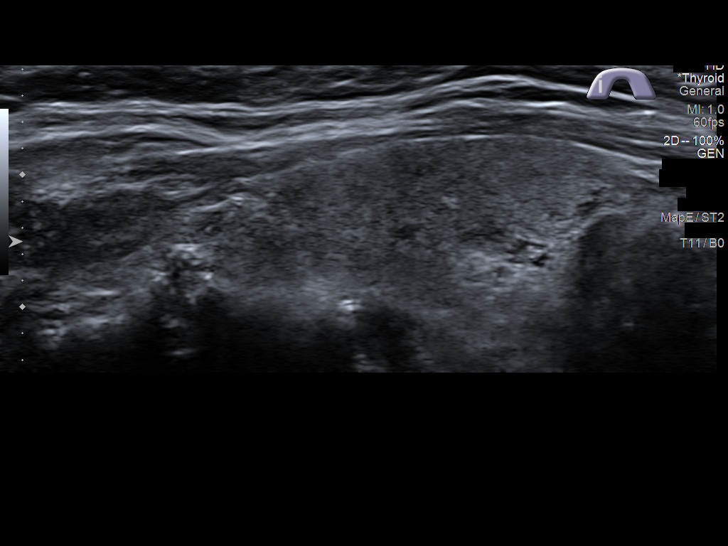
[im 9/54]
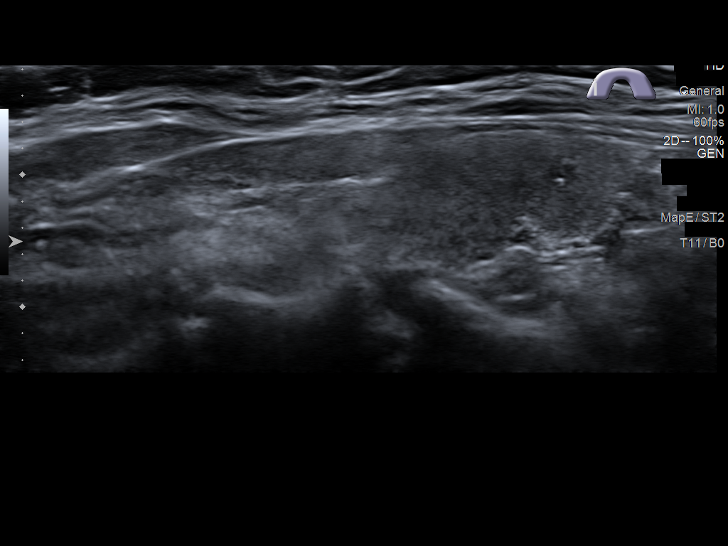
[im 14/54]
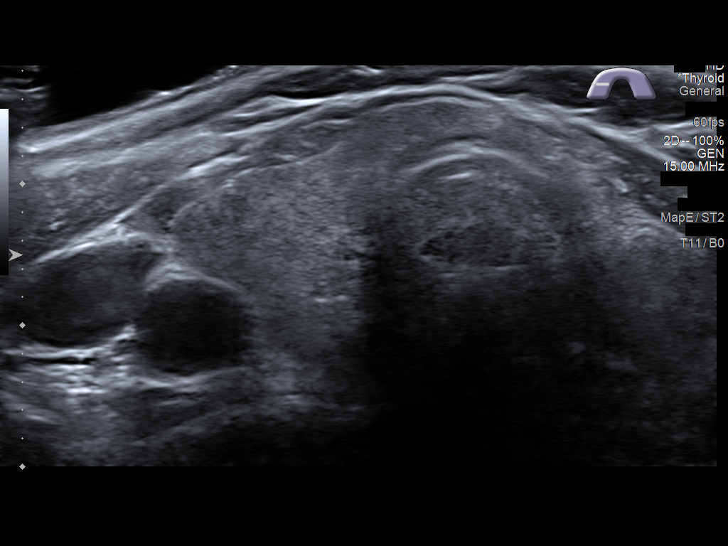
[im 18/54]
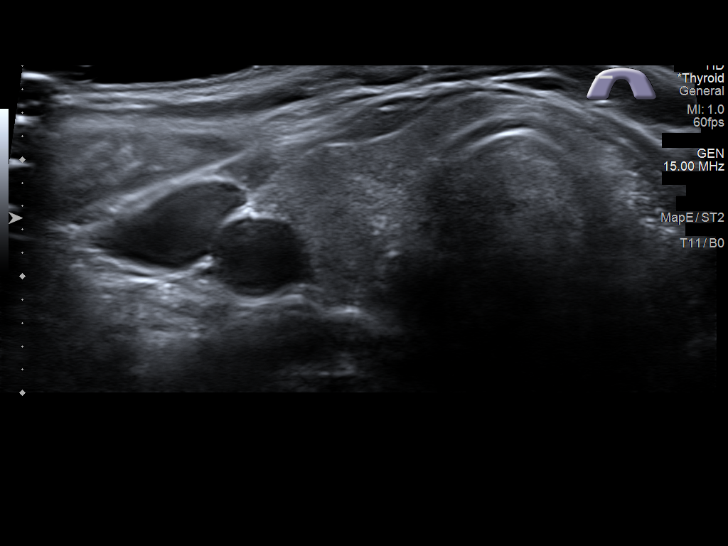
[im 20/54]
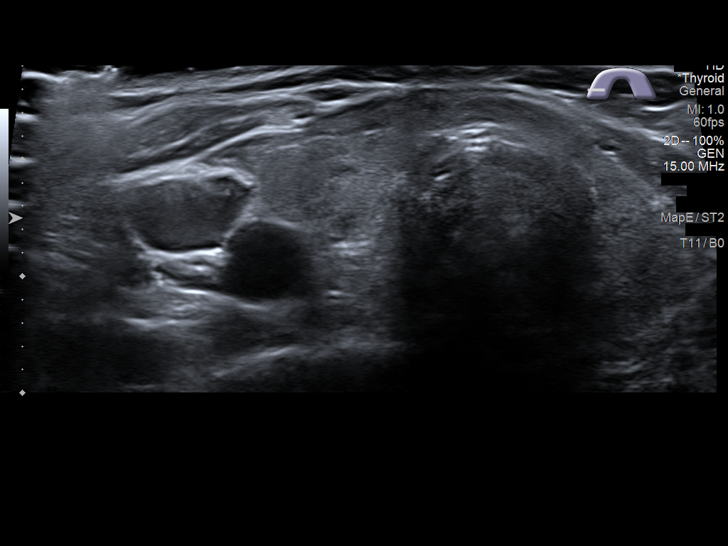
[im 25/54]
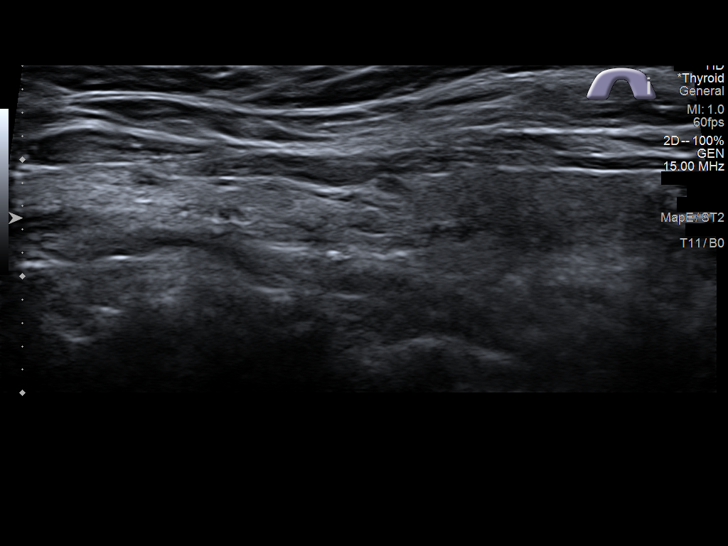
[im 29/54]
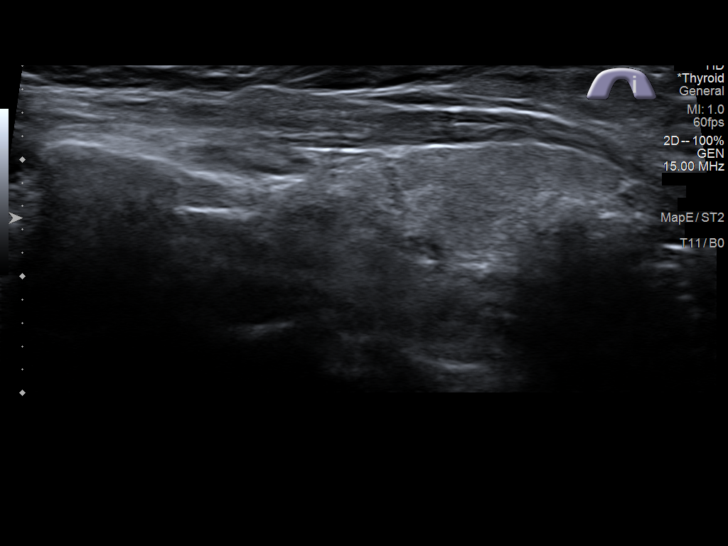
[im 34/54]
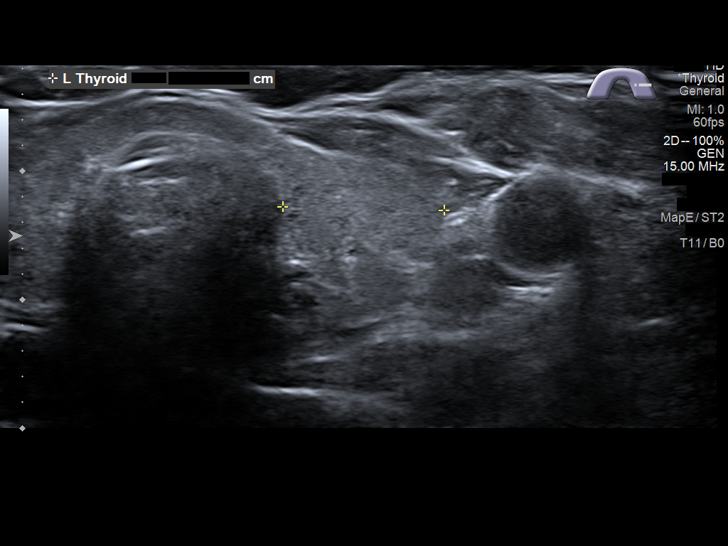
[im 36/54]
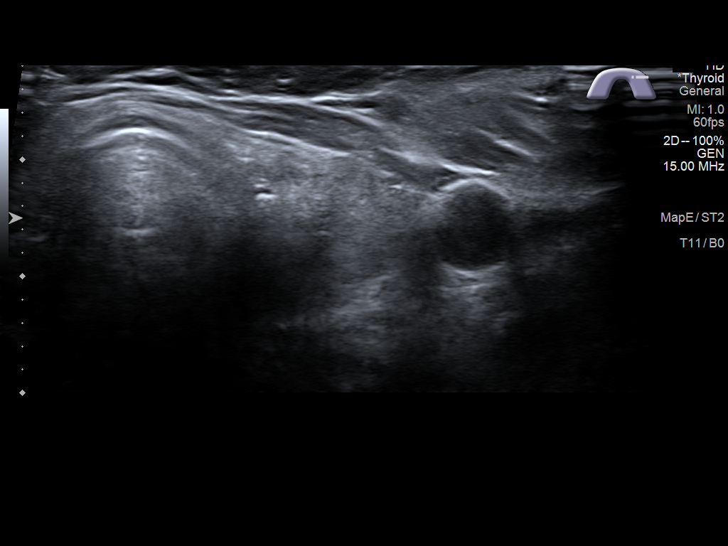
[im 40/54]
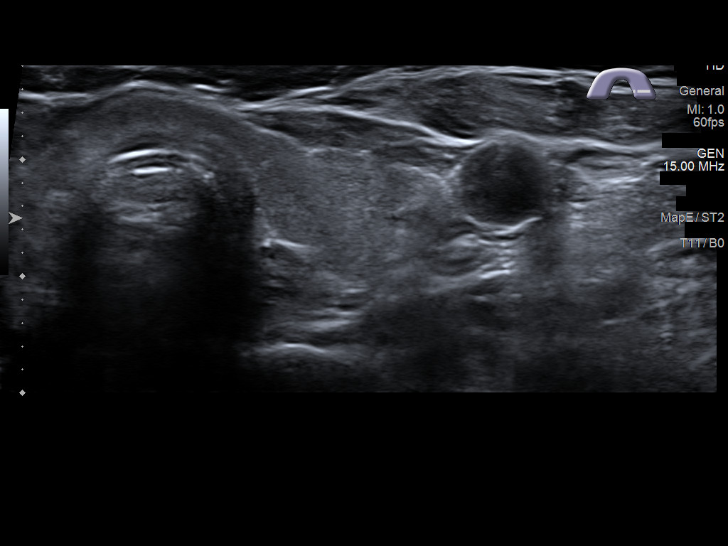
[im 45/54]
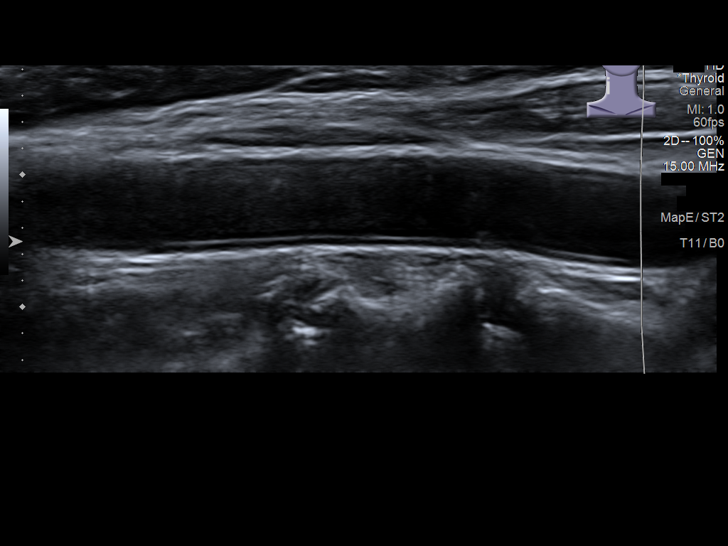
[im 49/54]
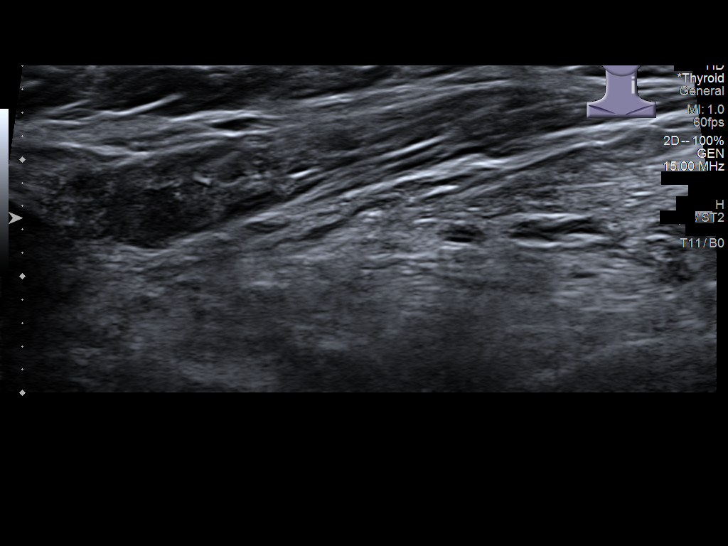
[im 54/54]
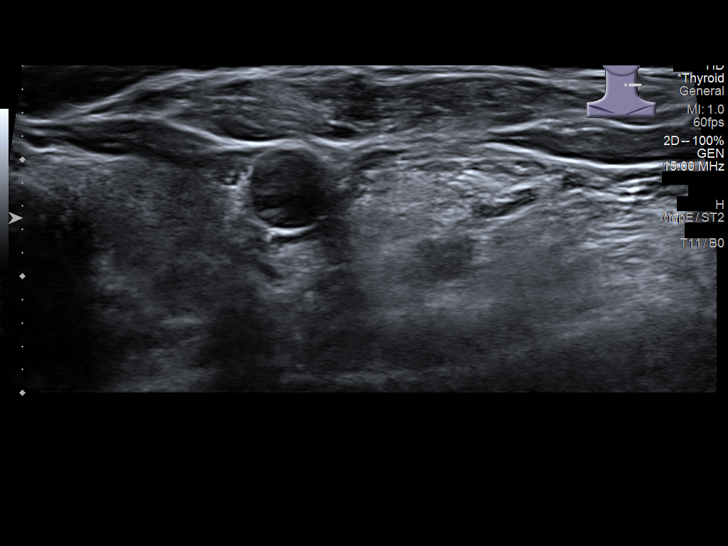

[14 of 25 positions shown; findings below may reference images not displayed]

FINDINGS: Parenchymal Echotexture: Mildly heterogenous

Isthmus: 0.3 cm

Right lobe: 3.3 x 1.2 x 1.5 cm

Left lobe: 3.2 x 1.3 x 1.3 cm

_________________________________________________________

Estimated total number of nodules >/= 1 cm: 0

Number of spongiform nodules >/=  2 cm not described below (TR1): 0

Number of mixed cystic and solid nodules >/= 1.5 cm not described
below (TR2): 0

_________________________________________________________

No discrete nodules are seen within the thyroid gland. No abnormal
lymph nodes are identified.
IMPRESSION: Unremarkable thyroid ultrasound.

The above is in keeping with the ACR TI-RADS recommendations - [HOSPITAL] 1238;[DATE].

## 2021-08-17 LAB — ALKALINE PHOSPHATASE, ISOENZYMES
Alk Phos Bone Fract: 16 % (ref 14–68)
Alk Phos Liver Fract: 84 % (ref 18–85)
Alk Phos: 143 IU/L — ABNORMAL HIGH (ref 44–121)
Intestinal %: 0 % (ref 0–18)

## 2021-08-17 LAB — KAPPA/LAMBDA LIGHT CHAINS
Kappa free light chain: 57.2 mg/L — ABNORMAL HIGH (ref 3.3–19.4)
Kappa, lambda light chain ratio: 6.43 — ABNORMAL HIGH (ref 0.26–1.65)
Lambda free light chains: 8.9 mg/L (ref 5.7–26.3)

## 2021-08-18 LAB — MULTIPLE MYELOMA PANEL, SERUM
Albumin SerPl Elph-Mcnc: 4 g/dL (ref 2.9–4.4)
Albumin/Glob SerPl: 1.1 (ref 0.7–1.7)
Alpha 1: 0.3 g/dL (ref 0.0–0.4)
Alpha2 Glob SerPl Elph-Mcnc: 0.9 g/dL (ref 0.4–1.0)
B-Globulin SerPl Elph-Mcnc: 1.1 g/dL (ref 0.7–1.3)
Gamma Glob SerPl Elph-Mcnc: 1.5 g/dL (ref 0.4–1.8)
Globulin, Total: 3.8 g/dL (ref 2.2–3.9)
IgA: 38 mg/dL — ABNORMAL LOW (ref 64–422)
IgG (Immunoglobin G), Serum: 1602 mg/dL (ref 586–1602)
IgM (Immunoglobulin M), Srm: 52 mg/dL (ref 26–217)
M Protein SerPl Elph-Mcnc: 1.2 g/dL — ABNORMAL HIGH
Total Protein ELP: 7.8 g/dL (ref 6.0–8.5)

## 2021-08-26 ENCOUNTER — Encounter: Payer: Self-pay | Admitting: Dietician

## 2021-08-26 ENCOUNTER — Encounter: Payer: Medicare Other | Attending: Internal Medicine | Admitting: Dietician

## 2021-08-26 VITALS — Ht 60.0 in | Wt 129.3 lb

## 2021-08-26 DIAGNOSIS — Z713 Dietary counseling and surveillance: Secondary | ICD-10-CM | POA: Insufficient documentation

## 2021-08-26 DIAGNOSIS — R7303 Prediabetes: Secondary | ICD-10-CM | POA: Insufficient documentation

## 2021-08-26 DIAGNOSIS — Z6825 Body mass index (BMI) 25.0-25.9, adult: Secondary | ICD-10-CM | POA: Insufficient documentation

## 2021-08-26 DIAGNOSIS — E785 Hyperlipidemia, unspecified: Secondary | ICD-10-CM | POA: Diagnosis not present

## 2021-08-26 NOTE — Patient Instructions (Signed)
Portion out snack foods and avoid eating from the large container.  ?Control starch portions with meals and make sure to include a protein food and a veg or fruit.  ?Gradually increase physical activity and exercise for calorie burning, blood sugar and cholesterol control. ?

## 2021-08-26 NOTE — Progress Notes (Signed)
Medical Nutrition Therapy: Visit start time: 1450  end time: 3790  ?Assessment:  Diagnosis: pre-diabetes, hyperlipidemia ?Past medical history: seasonal allergies ?Psychosocial issues/ stress concerns: none ? ?Preferred learning method:  ?Auditory ?Visual ?Hands-on ? ? ?Current weight: 129.2lbs Height: 5'0" BMI: 25.25 ? ?Medications, supplements: reconciled list in medical record ? 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 She was bringing therapy dog to visit with elderly and young children.  ?These issues also led to increased food portions and comfort food choices ie ice cream, starches, chips, etc. ?She has not cooked much and reports decreased interest in cooking.  ?Mother often did not make healthy food choices, even when patient was a child, so she has grown accustomed to unhealthy eating pattern. ?She would like to lose back down to her normal weight of about 115lbs. ?Recent labs (07/14/21) indicate Total cholesterol 249, HDL 69.5, LDL 155, Triglycerides 121, HbA1C 6.2%. ? ?Physical activity: some dog walking, playing in backyard with dog ? ?Dietary Intake:  ?Usual eating pattern includes 3 meals and 1-2 snacks per day. ?Dining out frequency: 0-1 meals per week. ? ?Breakfast: yogurt ?Snack: none ?Lunch: peanut butter or tuna sandwich + glucerna/ protein drink ?Snack: rarely ?Supper: difficulty choosing what to eat; frozen meal; quick meal -- likes starches ie bread, starches, not much meat ?Snack: ice cream ?Beverages: water 6-7cups daily; energy drink ? ?Intervention:  ? ?Nutrition Care Education: ?  ?Basic nutrition: basic food groups; appropriate nutrient balance; appropriate meal and snack schedule; general nutrition guidelines    ?Weight control: importance of low sugar and low fat choices, portion control  strategies including avoiding eating from large containers, managing food cravings; estimated energy needs for gradual weight loss at 1100 - 1200 kcal, provided guidance for 45% CHO, 25% pro, 30% fat ?Advanced nutrition:  cooking techniques for quick and simple meals ?Pre-diabetes: appropriate meal and snack schedule, appropriate carb intake and balance, healthy carb choices, role of fiber, protein, fat ?Hyperlipidemia:  healthy and unhealthy fats, role of fiber, role of exercise ? ?Other: ?Patient feels portion control is her main issue, but also feels she needs to increase vegetable and fruit intake.  ?Established nutrition goals to help with these issues and accommodate patient's preferences and lifestyle. ?She would like to return for follow up to help with accountability. ? ? ?Nutritional Diagnosis:  [facility]-2.2 Altered nutrition-related laboratory As related to pre-diabetes, hyperlipidemia.  As evidenced by elevated total cholesterol and LDL, elevated HbA1C. ? ? ?Education Materials given:  ?General diet guidelines for Heart health ?Plate Planner with food lists, sample meal pattern ?Sample menus ?Snacking handout ?Visit summary with goals/ instructions ? ? ?Learner/ who was taught:  ?Patient  ? ? ?Level of understanding: ?Verbalizes/ demonstrates competency ? ? ?Demonstrated degree of understanding via:   Teach back ?Learning barriers: ?None ? ?Willingness to learn/ readiness for change: ?Eager, change in progress ? ? ?Monitoring and Evaluation:  Dietary intake, exercise, blood lipids, BG control, and body weight ?     follow up:  10/09/21 at 10:00am   ?

## 2021-09-01 ENCOUNTER — Inpatient Hospital Stay: Payer: Medicare Other | Admitting: Oncology

## 2021-09-01 ENCOUNTER — Encounter: Payer: Self-pay | Admitting: Oncology

## 2021-09-01 VITALS — BP 150/76 | HR 98 | Temp 96.4°F | Resp 16 | Wt 129.1 lb

## 2021-09-01 DIAGNOSIS — D472 Monoclonal gammopathy: Secondary | ICD-10-CM

## 2021-09-01 DIAGNOSIS — Z85828 Personal history of other malignant neoplasm of skin: Secondary | ICD-10-CM | POA: Diagnosis not present

## 2021-09-01 DIAGNOSIS — R778 Other specified abnormalities of plasma proteins: Secondary | ICD-10-CM | POA: Diagnosis not present

## 2021-09-01 DIAGNOSIS — R5383 Other fatigue: Secondary | ICD-10-CM | POA: Diagnosis not present

## 2021-09-01 DIAGNOSIS — Z79899 Other long term (current) drug therapy: Secondary | ICD-10-CM | POA: Diagnosis not present

## 2021-09-01 NOTE — Progress Notes (Signed)
Pt states that she dealy with chronic fatigue syndrome in the 70's and has been feeling the same way for about the last year and will like to know if it is chronic fatigue once again or is something else going on?  ?

## 2021-09-01 NOTE — Progress Notes (Signed)
? ? ? ?Hematology/Oncology Consult note ?[place]  ?Telephone:([phone]) [phone] [phone] ? ?Patient Care Team: ?McLean-Scocuzza, Nino Glow, MD as PCP - General (Internal Medicine) ?Sindy Guadeloupe, MD as Consulting Physician (Oncology)  ? ?Name of the patient: Jamie Todd  ?[id number]  ?December 19, 1940  ? ?Date of visit: 09/01/21 ? ?Diagnosis-IgG kappa MGUS ? ?Chief complaint/ Reason for visit-discuss results of blood work ? ?Heme/Onc history: patient is a 81 year old female with a past medical history significant for hyperlipidemia ulcerative colitis who has been referred for abnormal SPEPHer most recent urine protein electrophoresis did not show any evidence of M protein however there was a spike of 1 g noted on her myeloma panel.  It was reported that this may be a monoclonal protein but could be seen by circulating immune complexes, cryoglobulins or hemolysis.  Alkaline phosphatase mildly elevated at 133.  CBC was normal with an H&H of 12.8/38.8.  CMP showed a creatinine of 0.9.  Total protein normal at 7.6 calcium level normal at 9.8.  B12 levels were elevated.  Ferritin levels were34 with an iron saturation of 18% and TIBC of 421. ?  ?Patient reports having significant fatigue throughout the day despite not doing much work.  Her appetite is not as good as before but she has gained weight.  States that she has not had any issues with her inflammatory bowel disease in a long time now. ? ?Interval history-reports chronic fatigue but denies new complaints at this time ? ?ECOG PS- 1 ?Pain scale- 0 ? ? ?Review of systems- Review of Systems  ?Constitutional:  Positive for malaise/fatigue. Negative for chills, fever and weight loss.  ?HENT:  Negative for congestion, ear discharge and nosebleeds.   ?Eyes:  Negative for blurred vision.  ?Respiratory:  Negative for cough, hemoptysis, sputum production, shortness of breath and wheezing.   ?Cardiovascular:  Negative for chest pain, palpitations,  orthopnea and claudication.  ?Gastrointestinal:  Negative for abdominal pain, blood in stool, constipation, diarrhea, heartburn, melena, nausea and vomiting.  ?Genitourinary:  Negative for dysuria, flank pain, frequency, hematuria and urgency.  ?Musculoskeletal:  Negative for back pain, joint pain and myalgias.  ?Skin:  Negative for rash.  ?Neurological:  Negative for dizziness, tingling, focal weakness, seizures, weakness and headaches.  ?Endo/Heme/Allergies:  Does not bruise/bleed easily.  ?Psychiatric/Behavioral:  Negative for depression and suicidal ideas. The patient does not have insomnia.    ? ? ?No Known Allergies ? ? ?Past Medical History:  ?Diagnosis Date  ? Basal cell carcinoma 04/23/2014  ? Right frontal scalp within hair line. Nodular. Excised 06/18/2014, margins free  ? Cat scratch fever   ? Chronic fatigue   ? Chronic migraine   ? as of 2020 resolved and no issues w/in the last 20 years per pt   ? Chronic sinusitis   ? Complication of anesthesia   ? slow to wake up  ? COVID-19   ? 04/2021  ? Depression   ? 1975 to 1995 professional help sought  ? EBV infection   ? Endometriosis   ? GERD (gastroesophageal reflux disease)   ? Hemochromatosis   ? Hyperlipidemia   ? Measles   ? Miscarriage   ? Osteoporosis   ? Prediabetes   ? Pyloric stenosis   ? Skin cancer   ? basal and squamous cell   ? Squamous cell carcinoma of skin 07/19/2008  ? Right lat. lower leg. SCCis  ? Type A blood, Rh negative   ? Ulcerative colitis ([place])   ?  1975 to 1995 tx'ed with medical care   ? UTI (urinary tract infection)   ? ? ? ?Past Surgical History:  ?Procedure Laterality Date  ? CATARACT EXTRACTION    ? b/l  ? COLONOSCOPY WITH PROPOFOL N/A 08/25/2017  ? Procedure: COLONOSCOPY WITH PROPOFOL;  Surgeon: Lucilla Lame, MD;  Location: [facility];  Service: Endoscopy;  Laterality: N/A;  wants to be last patient  ? OTHER SURGICAL HISTORY    ? laparoscopy several, D&Cs 1970s/1980s   ? ? ?Social History  ? ?Socioeconomic History  ?  Marital status: Divorced  ?  Spouse name: Not on file  ? Number of children: Not on file  ? Years of education: Not on file  ? Highest education level: Not on file  ?Occupational History  ? Not on file  ?Tobacco Use  ? Smoking status: Never  ? Smokeless tobacco: Never  ?Substance and Sexual Activity  ? Alcohol use: Yes  ?  Comment: ocass  ? Drug use: No  ? Sexual activity: Never  ?Other Topics Concern  ? Not on file  ?Social History Narrative  ? Grad school   ? 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 She likes animals and has therapy dogs   ? From [facility]  ? Former Dietitian in 2017   ? ?Social Determinants of Health  ? ?Financial Resource Strain: Not on file  ?Food Insecurity: Not on file  ?Transportation Needs: Not on file  ?Physical Activity: Not on file  ?Stress: Not on file  ?Social Connections: Not on file  ?Intimate Partner Violence: Not on file  ? ? ?Family History  ?Problem Relation Age of Onset  ? Dementia Mother   ? Heart disease Mother   ?     died at 42  ? Deafness Mother   ? Blindness Mother   ? Diabetes Other   ?     both sides of family   ? ? ? ?Current Outpatient Medications:  ?  acetaminophen (TYLENOL) 325 MG tablet, Take 650 mg by mouth every 6 (six) hours as needed., Disp: , Rfl:  ?  acidophilus (RISAQUAD) CAPS capsule, Take by mouth daily., Disp: , Rfl:  ?  b complex vitamins capsule, Take 1 capsule by mouth daily., Disp: , Rfl:  ?  Calcium Citrate (CITRACAL PO), Take by mouth., Disp: , Rfl:  ?  Calcium-Magnesium-Vitamin D (CALCIUM 500 PO), Take by mouth., Disp: , Rfl:  ?  cetirizine-pseudoephedrine (ZYRTEC-D) 5-120 MG tablet, Take 1 tablet by mouth daily., Disp: , Rfl:  ?  Cranberry 1000 MG CAPS, Take by mouth., Disp: , Rfl:  ?  dextromethorphan-guaiFENesin (MUCINEX DM) 30-600 MG 12hr tablet, Take 1 tablet by mouth 2 (two) times daily., Disp: , Rfl:  ?  fluticasone (FLONASE) 50 MCG/ACT  nasal spray, , Disp: , Rfl: 0 ?  hydrocortisone 2.5 % cream, Apply topically 2 (two) times daily as needed (Rash)., Disp: 28 g, Rfl: 2 ?  ibuprofen (ADVIL,MOTRIN) 200 MG tablet, Take 200 mg by mouth every 6 (six) hours as needed., Disp: , Rfl:  ?  MELATONIN PO, Take 6 mg by mouth., Disp: , Rfl:  ?  montelukast (SINGULAIR) 10 MG tablet, , Disp: , Rfl: 0 ?  Multiple Vitamin (MULTIVITAMIN) tablet, Take 1 tablet by mouth daily., Disp: , Rfl:  ?  Multiple Vitamins-Minerals (ALIVE WOMENS ENERGY PO), Take  by mouth., Disp: , Rfl:  ?  Multiple Vitamins-Minerals (COMPLETE ENERGY PO), Take by mouth., Disp: , Rfl:  ?  pravastatin (PRAVACHOL) 10 MG tablet, Take 1 tablet (10 mg total) by mouth daily., Disp: 90 tablet, Rfl: 1 ?  aspirin-acetaminophen-caffeine (EXCEDRIN MIGRAINE) 458-592-92 MG tablet, Take by mouth every 6 (six) hours as needed for headache. (Patient not taking: Reported on 08/14/2021), Disp: , Rfl:  ?  ciclopirox (LOPROX) 0.77 % SUSP, Apply to affected nails as needed., Disp: 30 mL, Rfl: 3 ?  metroNIDAZOLE (METROCREAM) 0.75 % cream, APPLY 1-2 TIMES DAILY ON THE SKIN AS NEEDED FOR FLARES, Disp: 45 g, Rfl: 3 ?  Misc Natural Products (NEURIVA PO), Take by mouth., Disp: , Rfl:  ?  pantoprazole (PROTONIX) 40 MG tablet, Take 1 tablet (40 mg total) by mouth daily. (Patient not taking: Reported on 08/14/2021), Disp: 30 tablet, Rfl: 1 ? ?Physical exam:  ?Vitals:  ? 09/01/21 1111  ?BP: (!) 150/76  ?Pulse: 98  ?Resp: 16  ?Temp: (!) 96.4 ?F (35.8 ?C)  ?SpO2: 99%  ?Weight: 129 lb 1.6 oz (58.6 kg)  ? ?Physical Exam ?Constitutional:   ?   General: She is not in acute distress. ?Cardiovascular:  ?   Rate and Rhythm: Normal rate and regular rhythm.  ?   Heart sounds: Normal heart sounds.  ?Pulmonary:  ?   Effort: Pulmonary effort is normal.  ?Skin: ?   General: Skin is warm and dry.  ?Neurological:  ?   Mental Status: She is alert and oriented to person, place, and time.  ?  ? ? ?  Latest Ref Rng & Units 08/14/2021  ?  2:22 PM  ?CMP   ?Glucose 70 - 99 mg/dL 99    ?BUN 8 - 23 mg/dL 27    ?Creatinine 0.44 - 1.00 mg/dL 0.78    ?Sodium 135 - 145 mmol/L 134    ?Potassium 3.5 - 5.1 mmol/L 4.0    ?Chloride 98 - 111 mmol/L 99    ?CO2 22 - 32 m

## 2021-09-03 NOTE — Progress Notes (Signed)
Patient on schedule for BMB 5/3, called and spoke with patient on phone with pre procedure instructions given. Made aware to be here @ 0730, NPO after MN prior to procedure as well as driver post procedure/recovery/discharge. Stated understanding. ?

## 2021-09-08 ENCOUNTER — Other Ambulatory Visit (HOSPITAL_COMMUNITY): Payer: Self-pay | Admitting: Radiology

## 2021-09-08 NOTE — H&P (Signed)
Chief Complaint: Patient was seen in consultation today for bone marrow biopsy and aspiration  at the request of Rao,Archana C  Referring Physician(s): Rao,Archana C  Supervising Physician: Pernell Dupre  Patient Status: [facility] - Out-pt  History of Present Illness: Jamie Todd is a 81 y.o. female with PMH of HLD, complication of anesthesia, depression, EBV infection, GERD, UC and UTI. Pt being followed by hematology after being referred for chronic fatigue and abnormal SPEP. Pt referred by Dr. Smith Robert for bone marrow biopsy to rule out multiple myeloma.   Past Medical History:  Diagnosis Date   Basal cell carcinoma 04/23/2014   Right frontal scalp within hair line. Nodular. Excised 06/18/2014, margins free   Cat scratch fever    Chronic fatigue    Chronic migraine    as of 2020 resolved and no issues w/in the last 20 years per pt    Chronic sinusitis    Complication of anesthesia    slow to wake up   COVID-19    04/2021   Depression    1975 to 1995 professional help sought   EBV infection    Endometriosis    GERD (gastroesophageal reflux disease)    Hemochromatosis    Hyperlipidemia    Measles    Miscarriage    Osteoporosis    Prediabetes    Pyloric stenosis    Skin cancer    basal and squamous cell    Squamous cell carcinoma of skin 07/19/2008   Right lat. lower leg. SCCis   Type A blood, Rh negative    Ulcerative colitis (HCC)    1975 to 1995 tx'ed with medical care    UTI (urinary tract infection)     Past Surgical History:  Procedure Laterality Date   CATARACT EXTRACTION     b/l   COLONOSCOPY WITH PROPOFOL N/A 08/25/2017   Procedure: COLONOSCOPY WITH PROPOFOL;  Surgeon: Midge Minium, MD;  Location: [facility];  Service: Endoscopy;  Laterality: N/A;  wants to be last patient   OTHER SURGICAL HISTORY     laparoscopy several, D&Cs 1970s/1980s     Allergies: Patient has no known allergies.  Medications: Prior to Admission medications    Medication Sig Start Date End Date Taking? Authorizing Provider  acetaminophen (TYLENOL) 325 MG tablet Take 650 mg by mouth every 6 (six) hours as needed.    [provider]  acidophilus (RISAQUAD) CAPS capsule Take by mouth daily.    [provider]  aspirin-acetaminophen-caffeine (EXCEDRIN MIGRAINE) [phone] MG tablet Take by mouth every 6 (six) hours as needed for headache. Patient not taking: Reported on 08/14/2021    [provider]  b complex vitamins capsule Take 1 capsule by mouth daily.    [provider]  Calcium Citrate (CITRACAL PO) Take by mouth.    [provider]  Calcium-Magnesium-Vitamin D (CALCIUM 500 PO) Take by mouth.    [provider]  cetirizine-pseudoephedrine (ZYRTEC-D) 5-120 MG tablet Take 1 tablet by mouth daily.    [provider]  ciclopirox (LOPROX) 0.77 % SUSP Apply to affected nails as needed. 02/07/20   Moye, [place], MD  Cranberry 1000 MG CAPS Take by mouth.    [provider]  dextromethorphan-guaiFENesin (MUCINEX DM) 30-600 MG 12hr tablet Take 1 tablet by mouth 2 (two) times daily.    [provider]  fluticasone Aleda Grana) 50 MCG/ACT nasal spray  04/15/17   [provider]  hydrocortisone 2.5 % cream Apply topically 2 (two) times daily  as needed (Rash). 01/27/21   Moye, [place], MD  ibuprofen (ADVIL,MOTRIN) 200 MG tablet Take 200 mg by mouth every 6 (six) hours as needed.    [provider]  MELATONIN PO Take 6 mg by mouth.    [provider]  metroNIDAZOLE (METROCREAM) 0.75 % cream APPLY 1-2 TIMES DAILY ON THE SKIN AS NEEDED FOR FLARES 07/16/20   Moye, [place], MD  Misc Natural Products (NEURIVA PO) Take by mouth.    [provider]  montelukast (SINGULAIR) 10 MG tablet  03/07/17   [provider]  Multiple Vitamin (MULTIVITAMIN) tablet Take 1 tablet by mouth daily.    [provider]  Multiple Vitamins-Minerals (ALIVE WOMENS  ENERGY PO) Take by mouth.    [provider]  Multiple Vitamins-Minerals (COMPLETE ENERGY PO) Take by mouth.    [provider]  pantoprazole (PROTONIX) 40 MG tablet Take 1 tablet (40 mg total) by mouth daily. Patient not taking: Reported on 08/14/2021 09/05/18 08/14/21  Emily Filbert, MD  pravastatin (PRAVACHOL) 10 MG tablet Take 1 tablet (10 mg total) by mouth daily. 07/20/21   McLean-Scocuzza, Pasty Spillers, MD     Family History  Problem Relation Age of Onset   Dementia Mother    Heart disease Mother        died at 21   Deafness Mother    Blindness Mother    Diabetes Other        both sides of family     Social History   Socioeconomic History   Marital status: Divorced    Spouse name: Not on file   Number of children: Not on file   Years of education: Not on file   Highest education level: Not on file  Occupational History   Not on file  Tobacco Use   Smoking status: Never   Smokeless tobacco: Never  Substance and Sexual Activity   Alcohol use: Yes    Comment: ocass   Drug use: No   Sexual activity: Never  Other Topics Concern   Not on file  Social History Narrative   Grad school    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  All other systems are negative.  Review of Systems  Constitutional:  Positive for fatigue. Negative for chills and fever.  Eyes:  Negative for visual disturbance.  Respiratory:  Negative for cough and shortness  of breath.   Cardiovascular:  Negative for chest pain and leg swelling.  Gastrointestinal:  Negative for abdominal pain, nausea and vomiting.  Neurological:  Negative for dizziness, weakness and headaches.   Vital Signs: BP (!) 144/71   Pulse 72   Temp 97.9 F (36.6 C) (Oral)   Resp 15   Ht 5' (1.524 m)   Wt 125 lb (56.7 kg)   SpO2 99%   BMI 24.41 kg/m   Physical Exam Constitutional:  General: She is not in acute distress.    Appearance: Normal appearance. She is not ill-appearing.  HENT:     Head: Normocephalic and atraumatic.     Mouth/Throat:     Pharynx: Oropharynx is clear.  Eyes:     Extraocular Movements: Extraocular movements intact.     Pupils: Pupils are equal, round, and reactive to light.  Cardiovascular:     Rate and Rhythm: Normal rate and regular rhythm.     Pulses: Normal pulses.     Heart sounds: Normal heart sounds.  Pulmonary:     Effort: Pulmonary effort is normal. No respiratory distress.     Breath sounds: Normal breath sounds.  Abdominal:     General: Bowel sounds are normal. There is no distension.     Tenderness: There is no abdominal tenderness. There is no guarding.  Musculoskeletal:     Right lower leg: No edema.     Left lower leg: No edema.  Skin:    General: Skin is warm and dry.  Neurological:     Mental Status: She is alert and oriented to person, place, and time.  Psychiatric:        Mood and Affect: Mood normal.        Behavior: Behavior normal.        Thought Content: Thought content normal.        Judgment: Judgment normal.    Imaging: No results found.  Labs:  CBC: Recent Labs    07/14/21 0915 08/14/21 1422 09/09/21 0735  WBC 4.8 7.1 5.4  HGB 12.8 13.5 12.9  HCT 38.8 41.2 40.2  PLT 295.0 303 276    COAGS: No results for input(s): INR, APTT in the last 8760 hours.  BMP: Recent Labs    07/14/21 0915 08/14/21 1422  NA 137 134*  K 4.8 4.0  CL 102 99  CO2 28 27  GLUCOSE 103* 99  BUN 27* 27*   CALCIUM 9.8 9.7  CREATININE 0.91 0.78  GFRNONAA  --  >60    LIVER FUNCTION TESTS: Recent Labs    07/14/21 0915 07/23/21 0000 07/23/21 1416 07/23/21 1417 08/14/21 1422  BILITOT 0.3  --   --   --  0.3  AST 25  --   --   --  26  ALT 22  --   --   --  28  ALKPHOS 133* CANCELED 143*  --  120  PROT 7.6  --   --  7.6 8.4*  ALBUMIN 4.5  --   --   --  4.7    TUMOR MARKERS: No results for input(s): AFPTM, CEA, CA199, CHROMGRNA in the last 8760 hours.  Assessment and Plan: History of HLD, complication of anesthesia, depression, EBV infection, GERD, UC and UTI. Pt being followed by hematology after being referred for chronic fatigue and abnormal SPEP. Pt referred by Dr. Smith Robert for bone marrow biopsy to rule out multiple myeloma.   Pt resting on stretcher.  She is NPO per order.  Pt does not take blood thinning medication.  Today's labs pending.   Risks and benefits of bone marrow biopsy and aspiration was discussed with the patient and/or patient's family including, but not limited to bleeding, infection, damage to adjacent structures or low yield requiring additional tests.  All of the questions were answered and there is agreement to proceed.  Consent signed and in chart.   Thank you for this interesting consult.  I greatly enjoyed meeting Jamie Todd and  look forward to participating in their care.  A copy of this report was sent to the requesting provider on this date.  Electronically Signed: Shon Hough, NP 09/09/2021, 8:34 AM   I spent a total of 20 minutes in face to face in clinical consultation, greater than 50% of which was counseling/coordinating care for bone marrow biopsy and aspiration.

## 2021-09-09 ENCOUNTER — Ambulatory Visit
Admission: RE | Admit: 2021-09-09 | Discharge: 2021-09-09 | Disposition: A | Discharge status: Home / Self Care | Payer: Medicare Other | Source: Ambulatory Visit | Attending: Oncology | Admitting: Oncology

## 2021-09-09 DIAGNOSIS — D472 Monoclonal gammopathy: Secondary | ICD-10-CM | POA: Insufficient documentation

## 2021-09-09 DIAGNOSIS — C9000 Multiple myeloma not having achieved remission: Secondary | ICD-10-CM | POA: Diagnosis not present

## 2021-09-09 DIAGNOSIS — R778 Other specified abnormalities of plasma proteins: Secondary | ICD-10-CM | POA: Insufficient documentation

## 2021-09-09 DIAGNOSIS — F32A Depression, unspecified: Secondary | ICD-10-CM | POA: Insufficient documentation

## 2021-09-09 DIAGNOSIS — Z8616 Personal history of COVID-19: Secondary | ICD-10-CM | POA: Diagnosis not present

## 2021-09-09 DIAGNOSIS — E785 Hyperlipidemia, unspecified: Secondary | ICD-10-CM | POA: Diagnosis not present

## 2021-09-09 DIAGNOSIS — Z87898 Personal history of other specified conditions: Secondary | ICD-10-CM | POA: Diagnosis not present

## 2021-09-09 DIAGNOSIS — R5382 Chronic fatigue, unspecified: Secondary | ICD-10-CM | POA: Insufficient documentation

## 2021-09-09 DIAGNOSIS — Z87440 Personal history of urinary (tract) infections: Secondary | ICD-10-CM | POA: Diagnosis not present

## 2021-09-09 DIAGNOSIS — Z8719 Personal history of other diseases of the digestive system: Secondary | ICD-10-CM | POA: Insufficient documentation

## 2021-09-09 DIAGNOSIS — J301 Allergic rhinitis due to pollen: Secondary | ICD-10-CM | POA: Diagnosis not present

## 2021-09-09 DIAGNOSIS — D4989 Neoplasm of unspecified behavior of other specified sites: Secondary | ICD-10-CM | POA: Diagnosis not present

## 2021-09-09 LAB — CBC WITH DIFFERENTIAL/PLATELET
Abs Immature Granulocytes: 0.02 10*3/uL (ref 0.00–0.07)
Basophils Absolute: 0 10*3/uL (ref 0.0–0.1)
Basophils Relative: 0 %
Eosinophils Absolute: 0.1 10*3/uL (ref 0.0–0.5)
Eosinophils Relative: 2 %
HCT: 40.2 % (ref 36.0–46.0)
Hemoglobin: 12.9 g/dL (ref 12.0–15.0)
Immature Granulocytes: 0 %
Lymphocytes Relative: 39 %
Lymphs Abs: 2.1 10*3/uL (ref 0.7–4.0)
MCH: 29.4 pg (ref 26.0–34.0)
MCHC: 32.1 g/dL (ref 30.0–36.0)
MCV: 91.6 fL (ref 80.0–100.0)
Monocytes Absolute: 0.5 10*3/uL (ref 0.1–1.0)
Monocytes Relative: 9 %
Neutro Abs: 2.6 10*3/uL (ref 1.7–7.7)
Neutrophils Relative %: 50 %
Platelets: 276 10*3/uL (ref 150–400)
RBC: 4.39 MIL/uL (ref 3.87–5.11)
RDW: 13.4 % (ref 11.5–15.5)
WBC: 5.4 10*3/uL (ref 4.0–10.5)
nRBC: 0 % (ref 0.0–0.2)

## 2021-09-09 MED ORDER — HEPARIN SOD (PORK) LOCK FLUSH 100 UNIT/ML IV SOLN
INTRAVENOUS | Status: AC
  Filled 2021-09-09: qty 5

## 2021-09-09 MED ORDER — SODIUM CHLORIDE 0.9 % IV SOLN
INTRAVENOUS | Status: DC
Start: 2021-09-09 — End: 2021-09-10
  Administered 2021-09-09: 1000 mL via INTRAVENOUS

## 2021-09-09 MED ORDER — FENTANYL CITRATE (PF) 100 MCG/2ML IJ SOLN
INTRAMUSCULAR | Status: AC | PRN
  Administered 2021-09-09: 25 ug via INTRAVENOUS

## 2021-09-09 MED ORDER — MIDAZOLAM HCL 2 MG/2ML IJ SOLN
INTRAMUSCULAR | Status: AC
  Filled 2021-09-09: qty 2

## 2021-09-09 MED ORDER — MIDAZOLAM HCL 5 MG/5ML IJ SOLN
INTRAMUSCULAR | Status: AC | PRN
Start: 2021-09-09 — End: 2021-09-09
  Administered 2021-09-09: 1 mg via INTRAVENOUS

## 2021-09-09 MED ORDER — FENTANYL CITRATE (PF) 100 MCG/2ML IJ SOLN
INTRAMUSCULAR | Status: AC
  Filled 2021-09-09: qty 2

## 2021-09-09 NOTE — Procedures (Signed)
Interventional Radiology Procedure Note ? ?Date of Procedure: 09/09/2021  ?Procedure: BMBx ? ?Findings:  ?1. CT BMBx right posterior ilium   ? ?Complications: No immediate complications noted.  ? ?Estimated Blood Loss: minimal ? ?Follow-up and Recommendations: ?1. Bedrest 1 hour  ? ? ?Albin Felling, MD  ?Vascular & Interventional Radiology  ?09/09/2021 9:54 AM ? ? ? ?

## 2021-09-14 LAB — SURGICAL PATHOLOGY

## 2021-09-23 ENCOUNTER — Encounter: Payer: Self-pay | Admitting: Oncology

## 2021-09-23 ENCOUNTER — Telehealth: Payer: Self-pay | Admitting: *Deleted

## 2021-09-23 ENCOUNTER — Inpatient Hospital Stay: Payer: Medicare Other | Attending: Oncology | Admitting: Oncology

## 2021-09-23 VITALS — BP 145/72 | HR 98 | Temp 97.9°F | Resp 16 | Ht 60.0 in | Wt 129.9 lb

## 2021-09-23 DIAGNOSIS — D472 Monoclonal gammopathy: Secondary | ICD-10-CM | POA: Insufficient documentation

## 2021-09-23 DIAGNOSIS — Z79899 Other long term (current) drug therapy: Secondary | ICD-10-CM | POA: Insufficient documentation

## 2021-09-24 ENCOUNTER — Ambulatory Visit
Admission: RE | Admit: 2021-09-24 | Discharge: 2021-09-24 | Disposition: A | Discharge status: Home / Self Care | Payer: Medicare Other | Source: Ambulatory Visit | Attending: Oncology | Admitting: Oncology

## 2021-09-24 ENCOUNTER — Ambulatory Visit
Admission: RE | Admit: 2021-09-24 | Discharge: 2021-09-24 | Disposition: A | Discharge status: Home / Self Care | Payer: Medicare Other | Attending: Oncology | Admitting: Oncology

## 2021-09-24 DIAGNOSIS — R778 Other specified abnormalities of plasma proteins: Secondary | ICD-10-CM | POA: Insufficient documentation

## 2021-09-24 DIAGNOSIS — R779 Abnormality of plasma protein, unspecified: Secondary | ICD-10-CM | POA: Diagnosis not present

## 2021-09-26 NOTE — Progress Notes (Signed)
Hematology/Oncology Consult note [facility]  [phone] Fax:([phone]  Patient Care Team: McLean-Scocuzza, Nino Glow, MD as PCP - General (Internal Medicine) Sindy Guadeloupe, MD as Consulting Physician (Oncology)   Name of the patient: Jamie Todd  [id number]  January 04, 1941   Date of visit: 09/26/21  Diagnosis-IgG kappa MGUS  Chief complaint/ Reason for visit-discussed results of bone marrow biopsy and further management  Heme/Onc history: patient is a 81 year old female with a past medical history significant for hyperlipidemia ulcerative colitis who has been referred for abnormal SPEPHer most recent urine protein electrophoresis did not show any evidence of M protein however there was a spike of 1 g noted on her myeloma panel.  It was reported that this may be a monoclonal protein but could be seen by circulating immune complexes, cryoglobulins or hemolysis.  Alkaline phosphatase mildly elevated at 133.  CBC was normal with an H&H of 12.8/38.8.  CMP showed a creatinine of 0.9.  Total protein normal at 7.6 calcium level normal at 9.8.  B12 levels were elevated.  Ferritin levels were34 with an iron saturation of 18% and TIBC of 421.    Interval history-fatigue patient denies other complaints at this time  ECOG PS- 1 Pain scale- 0   Review of systems- Review of Systems  Constitutional:  Positive for malaise/fatigue. Negative for chills, fever and weight loss.  HENT:  Negative for congestion, ear discharge and nosebleeds.   Eyes:  Negative for blurred vision.  Respiratory:  Negative for cough, hemoptysis, sputum production, shortness of breath and wheezing.   Cardiovascular:  Negative for chest pain, palpitations, orthopnea and claudication.  Gastrointestinal:  Negative for abdominal pain, blood in stool, constipation, diarrhea, heartburn, melena, nausea and vomiting.  Genitourinary:  Negative for dysuria, flank pain, frequency, hematuria  and urgency.  Musculoskeletal:  Negative for back pain, joint pain and myalgias.  Skin:  Negative for rash.  Neurological:  Negative for dizziness, tingling, focal weakness, seizures, weakness and headaches.  Endo/Heme/Allergies:  Does not bruise/bleed easily.  Psychiatric/Behavioral:  Negative for depression and suicidal ideas. The patient does not have insomnia.      No Known Allergies   Past Medical History:  Diagnosis Date   Basal cell carcinoma 04/23/2014   Right frontal scalp within hair line. Nodular. Excised 06/18/2014, margins free   Cat scratch fever    Chronic fatigue    Chronic migraine    as of 2020 resolved and no issues w/in the last 20 years per pt    Chronic sinusitis    Complication of anesthesia    slow to wake up   COVID-19    04/2021   Depression    1975 to 1995 professional help sought   EBV infection    Endometriosis    GERD (gastroesophageal reflux disease)    Hemochromatosis    Hyperlipidemia    Measles    Miscarriage    Osteoporosis    Prediabetes    Pyloric stenosis    Skin cancer    basal and squamous cell    Squamous cell carcinoma of skin 07/19/2008   Right lat. lower leg. SCCis   Type A blood, Rh negative    Ulcerative colitis ([place])    1975 to 1995 tx'ed with medical care    UTI (urinary tract infection)      Past Surgical History:  Procedure Laterality Date   CATARACT EXTRACTION     b/l   COLONOSCOPY WITH PROPOFOL N/A 08/25/2017  Procedure: COLONOSCOPY WITH PROPOFOL;  Surgeon: Lucilla Lame, MD;  Location: [facility];  Service: Endoscopy;  Laterality: N/A;  wants to be last patient   OTHER SURGICAL HISTORY     laparoscopy several, D&Cs 1970s/1980s     Social History   Socioeconomic History   Marital status: Divorced    Spouse name: Not on file   Number of children: Not on file   Years of education: Not on file   Highest education level: Not on file  Occupational History   Not on file  Tobacco Use   Smoking  status: Never   Smokeless tobacco: Never  Vaping Use   Vaping Use: Never used  Substance and Sexual Activity   Alcohol use: Yes    Comment: 1 a week   Drug use: No   Sexual activity: Never  Other Topics Concern   Not on file  Social History Narrative   Grad school    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 she was legally blind and deaf and heart problems and dementia x 8 years but mom died    She likes animals and has therapy dogs    From [place]   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, hospital in 2017    Social Determinants of Health   Financial Resource Strain: Not on file  Food Insecurity: Not on file  Transportation Needs: Not on file  Physical Activity: Not on file  Stress: Not on file  Social Connections: Not on file  Intimate Partner Violence: Not on file    Family History  Problem Relation Age of Onset   Dementia Mother    Heart disease Mother        died at 32   Deafness Mother    Blindness Mother    Diabetes Other        both sides of family      Current Outpatient Medications:    acetaminophen (TYLENOL) 325 MG tablet, Take 650 mg by mouth every 6 (six) hours as needed., Disp: , Rfl:    acidophilus (RISAQUAD) CAPS capsule, Take by mouth daily., Disp: , Rfl:    aspirin-acetaminophen-caffeine (EXCEDRIN MIGRAINE) 250-250-65 MG tablet, Take by mouth every 6 (six) hours as needed for headache., Disp: , Rfl:    b complex vitamins capsule, 1 capsule., Disp: , Rfl:    Calcium Citrate (CITRACAL PO), Take 1 capsule by mouth daily., Disp: , Rfl:    cetirizine-pseudoephedrine (ZYRTEC-D) 5-120 MG tablet, Take 1 tablet by mouth daily., Disp: , Rfl:    Cholecalciferol (VITAMIN D3) 25 MCG (1000 UT) CAPS, Take 1 capsule by mouth daily., Disp: , Rfl:    Cranberry 1000 MG CAPS, Take 1 capsule by mouth daily., Disp: , Rfl:    dextromethorphan-guaiFENesin (MUCINEX DM) 30-600 MG 12hr tablet, Take 1 tablet by mouth 2 (two) times daily., Disp: , Rfl:    fluticasone (FLONASE) 50 MCG/ACT  nasal spray, Place 2 sprays into both nostrils daily., Disp: , Rfl: 0   ibuprofen (ADVIL,MOTRIN) 200 MG tablet, Take 200 mg by mouth every 6 (six) hours as needed., Disp: , Rfl:    magnesium chloride (SLOW-MAG) 64 MG TBEC SR tablet, Take 1 tablet by mouth daily., Disp: , Rfl:    MELATONIN PO, Take 6 mg by mouth at bedtime., Disp: , Rfl:    montelukast (SINGULAIR) 10 MG tablet, Take 10 mg by mouth at bedtime., Disp: , Rfl: 0   Multiple Vitamin (MULTIVITAMIN) tablet, Take 1 tablet by mouth daily., Disp: ,  Rfl:    pravastatin (PRAVACHOL) 10 MG tablet, Take 1 tablet (10 mg total) by mouth daily., Disp: 90 tablet, Rfl: 1   Misc Natural Products (NEURIVA PO), Take by mouth. (Patient not taking: Reported on 09/23/2021), Disp: , Rfl:   Physical exam:  Vitals:   09/23/21 1322  BP: (!) 145/72  Pulse: 98  Resp: 16  Temp: 97.9 F (36.6 C)  TempSrc: Tympanic  Weight: 129 lb 14.4 oz (58.9 kg)  Height: 5' (1.524 m)   Physical Exam Constitutional:      General: She is not in acute distress. Cardiovascular:     Rate and Rhythm: Normal rate and regular rhythm.     Heart sounds: Normal heart sounds.  Pulmonary:     Effort: Pulmonary effort is normal.     Breath sounds: Normal breath sounds.  Skin:    General: Skin is warm and dry.  Neurological:     Mental Status: She is alert and oriented to person, place, and time.        Latest Ref Rng & Units 08/14/2021    2:22 PM  CMP  Glucose 70 - 99 mg/dL 99    BUN 8 - 23 mg/dL 27    Creatinine 0.44 - 1.00 mg/dL 0.78    Sodium 135 - 145 mmol/L 134    Potassium 3.5 - 5.1 mmol/L 4.0    Chloride 98 - 111 mmol/L 99    CO2 22 - 32 mmol/L 27    Calcium 8.9 - 10.3 mg/dL 9.7    Total Protein 6.5 - 8.1 g/dL 8.4    Total Bilirubin 0.3 - 1.2 mg/dL 0.3    Alkaline Phos 38 - 126 U/L 120    AST 15 - 41 U/L 26    ALT 0 - 44 U/L 28        Latest Ref Rng & Units 09/09/2021    7:35 AM  CBC  WBC 4.0 - 10.5 K/uL 5.4    Hemoglobin 12.0 - 15.0 g/dL 12.9     Hematocrit 36.0 - 46.0 % 40.2    Platelets 150 - 400 K/uL 276      No images are attached to the encounter.  DG Bone Survey Met  Result Date: 09/25/2021 CLINICAL DATA:  Abnormal serum plasma electrophoresis protein. EXAM: METASTATIC BONE SURVEY COMPARISON:  February 24, 2021. FINDINGS: No definite lytic or sclerotic lesion is seen involving the skeleton. Mild multilevel degenerative changes are noted in the cervical spine. No fracture or dislocation is noted. IMPRESSION: No definite lytic or sclerotic lesions are seen in the skeleton. Electronically Signed   By: Marijo Conception M.D.   On: 09/25/2021 08:27   CT BONE MARROW BIOPSY & ASPIRATION  Result Date: 09/09/2021 INDICATION: MGUS EXAM: CT BONE MARROW BIOPSY AND ASPIRATION MEDICATIONS: None. ANESTHESIA/SEDATION: Moderate (conscious) sedation was employed during this procedure. A total of Versed 1 mg and Fentanyl 25 mcg was administered intravenously. Moderate Sedation Time: 10 minutes. The patient's level of consciousness and vital signs were monitored continuously by radiology nursing throughout the procedure under my direct supervision. FLUOROSCOPY TIME:  N/a COMPLICATIONS: None immediate. PROCEDURE: Informed written consent was obtained from the patient after a thorough discussion of the procedural risks, benefits and alternatives. All questions were addressed. Maximal Sterile Barrier Technique was utilized including caps, mask, sterile gowns, sterile gloves, sterile drape, hand hygiene and skin antiseptic. A timeout was performed prior to the initiation of the procedure. The patient was placed prone on the CT exam  table. Limited CT of the pelvis was performed for planning purposes. Skin entry site was marked, and the overlying skin was prepped and draped in the standard sterile fashion. Local analgesia was obtained with 1% lidocaine. Using CT guidance, an 11 gauge needle was advanced just deep to the cortex of the right posterior ilium.  Subsequently, bone marrow aspiration and core biopsy were performed. Specimens were submitted to lab/pathology for handling. Hemostasis was achieved with manual pressure, and a clean dressing was placed. The patient tolerated the procedure well without immediate complication. IMPRESSION: Successful CT-guided bone marrow aspiration and core biopsy of the right posterior ilium. Electronically Signed   By: Albin Felling M.D.   On: 09/09/2021 10:43     Assessment and plan- Patient is a 81 y.o. female with history of IgG kappa MGUS here to discuss further management  Patient has a small amount of 1.2 g M protein IgG kappa noted on her myeloma panel.  However since serum free light chain ratio was elevated at 6.4 with a serum free kappa light chain of 57.2 I had obtained a bone marrow biopsy.  Bone marrow biopsy shows 10 to 15% plasma cells by CD138 kappa restricted.  Bone survey does not reveal any evidence of lytic lesions.  She does not have any crab criteria which would define multiple myeloma.  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  However this does not require any treatment at this time and can be followed conservatively.  I will check CBC ferritin and iron studies CMP myeloma panel and serum free light chains in 6 months and 3 months and I will see her back in 6 months.  Discussed differences between MGUS, smoldering multiple myeloma and overt multiple myeloma.  Discussed indications for treatment.  Patient verbalized understanding of the plan   Visit Diagnosis 1. MGUS (monoclonal gammopathy of unknown significance)      Dr. Randa Evens, MD, MPH [facility] [phone] 09/26/2021 11:25 AM

## 2021-10-09 ENCOUNTER — Encounter: Payer: Medicare Other | Attending: Internal Medicine | Admitting: Dietician

## 2021-10-09 ENCOUNTER — Encounter: Payer: Self-pay | Admitting: Dietician

## 2021-10-09 VITALS — Ht 60.0 in | Wt 127.1 lb

## 2021-10-09 DIAGNOSIS — Z713 Dietary counseling and surveillance: Secondary | ICD-10-CM | POA: Insufficient documentation

## 2021-10-09 DIAGNOSIS — E785 Hyperlipidemia, unspecified: Secondary | ICD-10-CM | POA: Diagnosis not present

## 2021-10-09 DIAGNOSIS — R7303 Prediabetes: Secondary | ICD-10-CM | POA: Diagnosis not present

## 2021-10-09 DIAGNOSIS — Z6824 Body mass index (BMI) 24.0-24.9, adult: Secondary | ICD-10-CM | POA: Diagnosis not present

## 2021-10-09 NOTE — Patient Instructions (Addendum)
Try smoothie with 1 cup fruit and a mild veg like carrots or cucumber + protein source if not hungry for a solid meal.  Look for container that keeps salad fresh longer.  Make use of frozen veg and fruits; keep freezer inventory list to help remember what is available

## 2021-10-09 NOTE — Progress Notes (Signed)
Medical Nutrition Therapy: Visit start time: 1000  end time: 7121  Assessment:  Diagnosis: pre-diabetes, hyperlipidemia Medical history changes: no changes Psychosocial issues/ stress concerns: none  Current weight: 127.1lbs Height: 5'0" BMI: 24.82  Medications, supplement changes: no changes per patient  Progress and evaluation:  Patient feels she is controlling portions well. Preparing meals for one is still difficult, as is planning ahead. Appetite continues to be low. Due to severe food poisoning (including hospitalization) in the past, she is leery of eating foods past prime freshness so keeping fresh produce is a challenge. She tends to forget what is in the freezer as well.  Weight has decreased by about 2lbs since previous visit on 08/26/21.   Physical activity: walking/ playing with dog several times daily, some pet-sitting, yardwork; averaging 6000-7000 steps daily  Dietary Intake:  Usual eating pattern includes 3 meals and 1-2 snacks per day. Dining out frequency: 0-1 meals per week.  Breakfast: yogurt (not a breakfast eater), usually not hungry until about 10am Snack: cereal with protein; cracker barrel brunch 1 pancake + milk + bacon Lunch: peanut butter or tuna sandwich + glucerna/ protein drink Snack: rarely Supper: frozen meal -- balanced ie shrimp and macaroni; occ sandwich if not hungry Snack: small portion ice cream (spoon rather than bowl) Beverages: water 6-7 cups daily; protein drink  Intervention:   Nutrition Care Education:  Basic nutrition: reviewed appropriate nutrient balance; appropriate meal and snack schedule; general nutrition guidelines    Weight control: reviewed progress since previous visit Advanced nutrition:  cooking techniques ie cool temp foods, liquid/ semi liquid options when not hungry Pre-Diabetes:  appropriate carb intake and balance; healthy carb choices role of protein Hyperlipidemia: healthy and unhealthy fats; appropriate food  choices    Nutritional Diagnosis:  Lely-2.2 Altered nutrition-related laboratory As related to pre-diabetes, hyperlipidemia.  As evidenced by elevated total cholesterol and LDL, elevated HbA1C.   Education Materials given:  Carb mindful smoothie recipe template Visit summary with Goals/ instructions   Learner/ who was taught:  Patient   Level of understanding: Verbalizes/ demonstrates competency   Demonstrated degree of understanding via:   Teach back Learning barriers: None  Willingness to learn/ readiness for change: Eager, change in progress   Monitoring and Evaluation:  Dietary intake, exercise, BG control, blood lipids, and body weight      follow up:  12/04/21 at 10:00am

## 2021-10-12 ENCOUNTER — Ambulatory Visit
Admission: RE | Admit: 2021-10-12 | Discharge: 2021-10-12 | Disposition: A | Discharge status: Home / Self Care | Payer: Medicare Other | Source: Ambulatory Visit | Attending: Internal Medicine | Admitting: Internal Medicine

## 2021-10-12 DIAGNOSIS — Z1231 Encounter for screening mammogram for malignant neoplasm of breast: Secondary | ICD-10-CM | POA: Insufficient documentation

## 2021-10-18 ENCOUNTER — Other Ambulatory Visit: Payer: Self-pay | Admitting: Internal Medicine

## 2021-10-18 DIAGNOSIS — Z1231 Encounter for screening mammogram for malignant neoplasm of breast: Secondary | ICD-10-CM

## 2021-12-04 ENCOUNTER — Encounter: Payer: Self-pay | Admitting: Dietician

## 2021-12-04 ENCOUNTER — Encounter: Payer: Medicare Other | Attending: Internal Medicine | Admitting: Dietician

## 2021-12-04 VITALS — Ht 60.0 in | Wt 130.8 lb

## 2021-12-04 DIAGNOSIS — E785 Hyperlipidemia, unspecified: Secondary | ICD-10-CM | POA: Insufficient documentation

## 2021-12-04 DIAGNOSIS — E663 Overweight: Secondary | ICD-10-CM | POA: Insufficient documentation

## 2021-12-04 DIAGNOSIS — R7303 Prediabetes: Secondary | ICD-10-CM | POA: Diagnosis not present

## 2021-12-04 DIAGNOSIS — Z6825 Body mass index (BMI) 25.0-25.9, adult: Secondary | ICD-10-CM | POA: Diagnosis not present

## 2021-12-04 DIAGNOSIS — Z713 Dietary counseling and surveillance: Secondary | ICD-10-CM | POA: Insufficient documentation

## 2021-12-04 NOTE — Progress Notes (Signed)
Medical Nutrition Therapy: Visit start time: 1000  end time: 1030  Assessment:  Diagnosis: pre-diabetes, hyperlipidemia Medical history changes: no changes Psychosocial issues/ stress concerns: none  Medications, supplement changes: no changes since previous visit   Current weight: 130.8lbs Height: 5'0" BMI: 25.55  Progress and evaluation:  Patient reports less physical activity due to hot weather; more liquid nutrition than solid foods. Appetite is low due to heat per patient; was low at time of previous visit 10/09/21 also. 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    Dietary Intake:  Usual eating pattern includes 3 meals and 0-1 snacks per day. Dining out frequency: 0-1 meals per week.  Breakfast: yogurt; protein drink Snack: none Lunch: protein drink or yogurt Snack: none Supper: meat + veg; frozen meal maybe 2x a week Snack: small portion ice cream Beverages: water, protein drinks  Physical activity: short duration walking, maybe 10 minutes  Intervention:   Nutrition Care Education:  Basic nutrition: reviewed appropriate nutrient balance; appropriate meal and snack schedule    Weight control: reviewed progress since previous visit Pre-diabetes:  reviewed appropriate carb intake and healthy carb choices  Hyperlipidemia:  reviewed food choices high in saturated fat and discussed lower fat alternatives  Nutritional Diagnosis:  [place]-2.2 Altered nutrition-related laboratory As related to pre-diabetes, hyperlipidemia.  As evidenced by elevated total cholesterol and LDL, elevated HbA1C.   Education Materials given:  Visit summary with Goals/ instructions   Learner/ who was taught:  Patient    Level of understanding: Verbalizes/ demonstrates competency   Demonstrated degree of  understanding via:   Teach back Learning barriers: None  Willingness to learn/ readiness for change: Eager, change in progress   Monitoring and Evaluation:  Dietary intake, exercise, BG control, blood lipids, and body weight      follow up:  02/05/22 at 10:00am

## 2021-12-04 NOTE — Patient Instructions (Signed)
Continue with cool, fresh food choices, low fat or fat free dairy.  Try a tossed salad with protein; tuna or chicken salad with raw veggies and/or fruit.  Control ice cream by eating small portions such as on a small cone, or pre-portions fudge or fruit bar (not chocolate coated). Try light or low fat/ slow churned version to reduce saturated fat.

## 2021-12-13 ENCOUNTER — Encounter: Payer: Self-pay | Admitting: Internal Medicine

## 2021-12-13 DIAGNOSIS — Z6825 Body mass index (BMI) 25.0-25.9, adult: Secondary | ICD-10-CM | POA: Insufficient documentation

## 2021-12-13 NOTE — Progress Notes (Signed)
Prediabetes Hld  Overweight BMI>25  Reason for nutrition referral    Dr. Olivia Mackie McLean-Scocuzza

## 2021-12-24 ENCOUNTER — Inpatient Hospital Stay: Payer: Medicare Other | Attending: Oncology

## 2021-12-24 DIAGNOSIS — R5382 Chronic fatigue, unspecified: Secondary | ICD-10-CM | POA: Diagnosis not present

## 2021-12-24 DIAGNOSIS — D472 Monoclonal gammopathy: Secondary | ICD-10-CM | POA: Insufficient documentation

## 2021-12-24 DIAGNOSIS — K5190 Ulcerative colitis, unspecified, without complications: Secondary | ICD-10-CM | POA: Insufficient documentation

## 2021-12-24 LAB — CBC WITH DIFFERENTIAL/PLATELET
Abs Immature Granulocytes: 0.02 10*3/uL (ref 0.00–0.07)
Basophils Absolute: 0 10*3/uL (ref 0.0–0.1)
Basophils Relative: 0 %
Eosinophils Absolute: 0.1 10*3/uL (ref 0.0–0.5)
Eosinophils Relative: 1 %
HCT: 39.8 % (ref 36.0–46.0)
Hemoglobin: 13.4 g/dL (ref 12.0–15.0)
Immature Granulocytes: 0 %
Lymphocytes Relative: 27 %
Lymphs Abs: 1.7 10*3/uL (ref 0.7–4.0)
MCH: 30.7 pg (ref 26.0–34.0)
MCHC: 33.7 g/dL (ref 30.0–36.0)
MCV: 91.1 fL (ref 80.0–100.0)
Monocytes Absolute: 0.4 10*3/uL (ref 0.1–1.0)
Monocytes Relative: 7 %
Neutro Abs: 4.1 10*3/uL (ref 1.7–7.7)
Neutrophils Relative %: 65 %
Platelets: 298 10*3/uL (ref 150–400)
RBC: 4.37 MIL/uL (ref 3.87–5.11)
RDW: 13.3 % (ref 11.5–15.5)
WBC: 6.3 10*3/uL (ref 4.0–10.5)
nRBC: 0 % (ref 0.0–0.2)

## 2021-12-24 LAB — COMPREHENSIVE METABOLIC PANEL
ALT: 37 U/L (ref 0–44)
AST: 32 U/L (ref 15–41)
Albumin: 4.4 g/dL (ref 3.5–5.0)
Alkaline Phosphatase: 127 U/L — ABNORMAL HIGH (ref 38–126)
Anion gap: 7 (ref 5–15)
BUN: 31 mg/dL — ABNORMAL HIGH (ref 8–23)
CO2: 25 mmol/L (ref 22–32)
Calcium: 9.7 mg/dL (ref 8.9–10.3)
Chloride: 107 mmol/L (ref 98–111)
Creatinine, Ser: 0.88 mg/dL (ref 0.44–1.00)
GFR, Estimated: >60 mL/min (ref >60)
Glucose, Bld: 129 mg/dL — ABNORMAL HIGH (ref 70–99)
Potassium: 4.1 mmol/L (ref 3.5–5.1)
Sodium: 139 mmol/L (ref 135–145)
Total Bilirubin: 0.5 mg/dL (ref 0.3–1.2)
Total Protein: 8.3 g/dL — ABNORMAL HIGH (ref 6.5–8.1)

## 2021-12-24 LAB — FERRITIN: Ferritin: 36 ng/mL (ref 11–307)

## 2021-12-24 LAB — IRON AND TIBC
Iron: 109 ug/dL (ref 28–170)
Saturation Ratios: 28 % (ref 10.4–31.8)
TIBC: 393 ug/dL (ref 250–450)
UIBC: 284 ug/dL

## 2021-12-25 LAB — KAPPA/LAMBDA LIGHT CHAINS
Kappa free light chain: 67.6 mg/L — ABNORMAL HIGH (ref 3.3–19.4)
Kappa, lambda light chain ratio: 6.69 — ABNORMAL HIGH (ref 0.26–1.65)
Lambda free light chains: 10.1 mg/L (ref 5.7–26.3)

## 2021-12-29 LAB — MULTIPLE MYELOMA PANEL, SERUM
Albumin SerPl Elph-Mcnc: 3.8 g/dL (ref 2.9–4.4)
Albumin/Glob SerPl: 1.1 (ref 0.7–1.7)
Alpha 1: 0.3 g/dL (ref 0.0–0.4)
Alpha2 Glob SerPl Elph-Mcnc: 0.9 g/dL (ref 0.4–1.0)
B-Globulin SerPl Elph-Mcnc: 1.1 g/dL (ref 0.7–1.3)
Gamma Glob SerPl Elph-Mcnc: 1.4 g/dL (ref 0.4–1.8)
Globulin, Total: 3.6 g/dL (ref 2.2–3.9)
IgA: 36 mg/dL — ABNORMAL LOW (ref 64–422)
IgG (Immunoglobin G), Serum: 1479 mg/dL (ref 586–1602)
IgM (Immunoglobulin M), Srm: 41 mg/dL (ref 26–217)
M Protein SerPl Elph-Mcnc: 0.9 g/dL — ABNORMAL HIGH
Total Protein ELP: 7.4 g/dL (ref 6.0–8.5)

## 2022-01-01 ENCOUNTER — Telehealth: Payer: Self-pay | Admitting: Internal Medicine

## 2022-01-01 NOTE — Telephone Encounter (Signed)
Copied from [place] [id number]. Topic: Medicare AWV >> Jan 01, 2022 10:16 AM Devoria Glassing wrote: Reason for CRM: Called patient to schedule Annual Wellness Visit.  Please schedule with Nurse Health Advisor Denisa O'Brien-Blaney, LPN at [facility]. This appt can be telephone or office visit.  Please call [phone] ask for [facility]

## 2022-01-04 ENCOUNTER — Other Ambulatory Visit: Payer: Self-pay | Admitting: Internal Medicine

## 2022-01-04 ENCOUNTER — Encounter: Payer: Self-pay | Admitting: Internal Medicine

## 2022-01-04 ENCOUNTER — Telehealth: Payer: Self-pay | Admitting: Internal Medicine

## 2022-01-04 DIAGNOSIS — D472 Monoclonal gammopathy: Secondary | ICD-10-CM | POA: Insufficient documentation

## 2022-01-04 DIAGNOSIS — E785 Hyperlipidemia, unspecified: Secondary | ICD-10-CM

## 2022-01-04 MED ORDER — PRAVASTATIN SODIUM 10 MG PO TABS: 10.0000 mg | ORAL_TABLET | Freq: Every day | ORAL | 3 refills | Status: DC

## 2022-01-04 NOTE — Telephone Encounter (Signed)
Baxter Flattery from [place] want to know if the pt is still taking the pravastatin

## 2022-01-04 NOTE — Telephone Encounter (Signed)
Yes to take pravastatin

## 2022-01-04 NOTE — Telephone Encounter (Signed)
Yes it safe to take pravastatin take in the am after dinner

## 2022-01-06 ENCOUNTER — Telehealth: Payer: Self-pay

## 2022-01-06 NOTE — Telephone Encounter (Signed)
Tried to call tara back from [facility] in regards to Dr. Olivia Mackie but can not get through to this nu,ber it does not ring or anything.  McLean-Scocuzza, Nino Glow, MD  You 2 days ago   TM Yes to take pravastatin         Note     You  McLean-Scocuzza, Nino Glow, MD 2 days ago    Misunderstanding. Someone from her insurance called wanting to know is she still taking the medication? I saw in her chart that it was last filled for a 90 day supply which ended on June. Is she still supposed to be taking it or no ?     McLean-Scocuzza, Nino Glow, MD  You 2 days ago   TM Yes it safe to take pravastatin take in the am after dinner         Note    You  McLean-Scocuzza, Nino Glow, MD 2 days ago    Pls advise before I call [employer] Baxter Flattery) back is it safe to say she is not still taking the pravstatin? I see in her chart it was last filled on 07/20/21 with a 90 day supply but she had refilled it yet her 90 days would have been up in June     Dawson, Caryl Pina D routed conversation to You 2 days ago   Burt Knack D 2 days ago   [place] from [facility] want to know if the pt is still taking the pravastatin       Note    tara [phone]  Burt Knack D 2 days ago   option 2   Incoming call

## 2022-01-12 ENCOUNTER — Ambulatory Visit (INDEPENDENT_AMBULATORY_CARE_PROVIDER_SITE_OTHER): Payer: Medicare Other | Admitting: Internal Medicine

## 2022-01-12 ENCOUNTER — Encounter: Payer: Self-pay | Admitting: Internal Medicine

## 2022-01-12 VITALS — BP 122/64 | HR 66 | Temp 98.3°F | Ht 60.0 in | Wt 129.8 lb

## 2022-01-12 DIAGNOSIS — F339 Major depressive disorder, recurrent, unspecified: Secondary | ICD-10-CM | POA: Diagnosis not present

## 2022-01-12 DIAGNOSIS — Z1211 Encounter for screening for malignant neoplasm of colon: Secondary | ICD-10-CM

## 2022-01-12 DIAGNOSIS — R009 Unspecified abnormalities of heart beat: Secondary | ICD-10-CM | POA: Diagnosis not present

## 2022-01-12 DIAGNOSIS — R9431 Abnormal electrocardiogram [ECG] [EKG]: Secondary | ICD-10-CM

## 2022-01-12 DIAGNOSIS — D472 Monoclonal gammopathy: Secondary | ICD-10-CM | POA: Diagnosis not present

## 2022-01-12 DIAGNOSIS — Z1231 Encounter for screening mammogram for malignant neoplasm of breast: Secondary | ICD-10-CM

## 2022-01-12 MED ORDER — CITALOPRAM HYDROBROMIDE 10 MG PO TABS: 10.0000 mg | ORAL_TABLET | Freq: Every day | ORAL | 3 refills | Status: DC

## 2022-01-12 NOTE — Progress Notes (Signed)
Chief Complaint  Patient presents with   Follow-up    6 MONTH F/U    F/u  1. 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 Colonoscopy due refer back Dr. Allen Todd 3. 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  HENT:  Negative for hearing loss.   Eyes:  Negative for blurred vision.  Respiratory:  Negative for shortness of breath.   Cardiovascular:  Negative for chest pain.  Gastrointestinal:  Negative for abdominal pain and blood in stool.  Genitourinary:  Negative for dysuria.  Musculoskeletal:  Negative for falls and joint pain.  Skin:  Negative for rash.  Neurological:  Negative for headaches.  Psychiatric/Behavioral:  Positive for depression.    Past Medical History:  Diagnosis Date   Basal cell carcinoma 04/23/2014   Right frontal scalp within hair line. Nodular. Excised 06/18/2014, margins free   Cat scratch fever    Chronic fatigue    Chronic migraine    as of 2020 resolved and no issues w/in the last 20 years per pt    Chronic sinusitis    Complication of anesthesia    slow to wake up   COVID-19    04/2021   Depression    1975 to 1995 professional help sought   EBV infection    Endometriosis    GERD (gastroesophageal reflux disease)    Hemochromatosis    Hyperlipidemia    Measles    Miscarriage    Osteoporosis    Prediabetes    Pyloric stenosis    Skin cancer    basal and squamous cell    Squamous cell carcinoma of skin 07/19/2008   Right lat. lower leg. SCCis   Type A blood, Rh negative    Ulcerative colitis ([place])    1975 to 1995 tx'ed with medical care    UTI (urinary tract infection)    Past Surgical History:  Procedure Laterality Date   CATARACT EXTRACTION     b/l   COLONOSCOPY WITH PROPOFOL N/A 08/25/2017    Procedure: COLONOSCOPY WITH PROPOFOL;  Surgeon: Jamie Lame, MD;  Location: [facility];  Service: Endoscopy;  Laterality: N/A;  wants to be last patient   OTHER SURGICAL HISTORY     laparoscopy several, D&Cs 1970s/1980s    Family History  Problem Relation Age of Onset   Dementia Mother    Heart disease Mother        died at 52   Deafness Mother    Blindness Mother    Diabetes Other        both sides of family    Social History   Socioeconomic History   Marital status: Divorced    Spouse name: Not on file   Number of children: Not on file   Years of education: Not on file   Highest education level: Not on file  Occupational History   Not on file  Tobacco Use   Smoking status: Never   Smokeless tobacco: Never  Vaping Use   Vaping Use: Never used  Substance and Sexual Activity   Alcohol use: Yes    Comment: rarely   Drug use: No   Sexual activity: Never  Other Topics Concern   Not on file  Social History Narrative   Grad school    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   acidophilus (RISAQUAD) CAPS capsule Take by mouth daily.   aspirin-acetaminophen-caffeine (EXCEDRIN MIGRAINE) 250-250-65 MG tablet Take by mouth every 6 (six) hours as needed for headache.   b complex vitamins capsule 1 capsule.   Calcium Citrate (CITRACAL PO) Take 1 capsule by mouth daily.    cetirizine-pseudoephedrine (ZYRTEC-D) 5-120 MG tablet Take 1 tablet by mouth daily.   Cholecalciferol (VITAMIN D3) 25 MCG (1000 UT) CAPS Take 1 capsule by mouth daily.   citalopram (CELEXA) 10 MG tablet Take 1 tablet (10 mg total) by mouth daily.   Cranberry 1000 MG CAPS Take 1 capsule by mouth daily.   dextromethorphan-guaiFENesin (MUCINEX DM) 30-600 MG 12hr tablet Take 1 tablet by mouth 2 (two) times daily.   fluticasone (FLONASE) 50 MCG/ACT nasal spray Place 2 sprays into both nostrils daily.   magnesium chloride (SLOW-MAG) 64 MG TBEC SR tablet Take 1 tablet by mouth daily.   MELATONIN PO Take 6 mg by mouth at bedtime.   montelukast (SINGULAIR) 10 MG tablet Take 10 mg by mouth at bedtime.   Multiple Vitamin (MULTIVITAMIN) tablet Take 1 tablet by mouth daily.   pravastatin (PRAVACHOL) 10 MG tablet Take 1 tablet (10 mg total) by mouth daily.   [DISCONTINUED] ibuprofen (ADVIL,MOTRIN) 200 MG tablet Take 200 mg by mouth every 6 (six) hours as needed.   No Known Allergies Recent Results (from the past 2160 hour(s))  Iron and TIBC([facility])     Status: None   Collection Time: 12/24/21  1:27 PM  Result Value Ref Range   Iron 109 28 - 170 ug/dL   TIBC 393 250 - 450 ug/dL   Saturation Ratios 28 10.4 - 31.8 %   UIBC 284 ug/dL    Comment: Performed at [facility], [address] [place], [place] [address]  Ferritin     Status: None   Collection Time: 12/24/21  1:27 PM  Result Value Ref Range   Ferritin 36 11 - 307 ng/mL    Comment: Performed at [facility], [address] [place], [place] [address]  Kappa/lambda light chains     Status: Abnormal   Collection Time: 12/24/21  1:27 PM  Result Value Ref Range   Kappa free light chain 67.6 (H) 3.3 - 19.4 mg/L   Lambda free light chains 10.1 5.7 - 26.3 mg/L   Kappa, lambda light chain ratio 6.69 (H) 0.26 - 1.65    Comment: (NOTE) Performed At: [facility] [address] [place], [place]  [id number] Jamie Farmer MD GY:[id number]   Multiple Myeloma Panel (SPEP&IFE w/QIG)     Status: Abnormal   Collection Time: 12/24/21  1:27 PM  Result Value Ref Range   IgG (Immunoglobin G),  Serum 1,479 586 - 1,602 mg/dL   IgA 36 (L) 64 - 422 mg/dL    Comment: Result confirmed on concentration.   IgM (Immunoglobulin M), Srm 41 26 - 217 mg/dL   Total Protein ELP 7.4 6.0 - 8.5 g/dL   Albumin SerPl Elph-Mcnc 3.8 2.9 - 4.4 g/dL   Alpha 1 0.3 0.0 - 0.4 g/dL   Alpha2 Glob SerPl Elph-Mcnc 0.9 0.4 - 1.0 g/dL   B-Globulin SerPl Elph-Mcnc 1.1 0.7 - 1.3 g/dL   Gamma Glob SerPl Elph-Mcnc 1.4 0.4 - 1.8 g/dL   M Protein SerPl Elph-Mcnc 0.9 (H) Not Observed g/dL   Globulin, Total 3.6 2.2 - 3.9 g/dL   Albumin/Glob SerPl 1.1 0.7 - 1.7   IFE 1 Comment (A)     Comment: (NOTE) Immunofixation shows IgG monoclonal protein with kappa light chain specificity. PLEASE NOTE: Samples from patients receiving DARZALEX(R) (daratumumab) or SARCLISA(R)(isatuximab-irfc) treatment can appear as an "IgG kappa" and mask a complete response (CR). If this patient is receiving these therapies, this IFE assay interference can be removed by ordering test number 123218-"Immunofixation, Daratumumab-Specific, Serum" or [id number]-"Immunofixation, Isatuximab-Specific, Serum" and submitting a new sample for testing or by calling the lab to add this test to the current sample.    Please Note Comment     Comment: (NOTE) Protein electrophoresis scan will follow via computer, mail, or courier delivery. Performed At: [facility] [place], [place] [id number] Jamie Farmer MD [id number]   Comprehensive metabolic panel     Status: Abnormal   Collection Time: 12/24/21  1:27 PM  Result Value Ref Range   Sodium 139 135 - 145 mmol/L   Potassium 4.1 3.5 - 5.1 mmol/L   Chloride 107 98 - 111 mmol/L   CO2 25 22 - 32 mmol/L   Glucose, Bld 129 (H) 70 - 99 mg/dL    Comment: Glucose reference range applies  only to samples taken after fasting for at least 8 hours.   BUN 31 (H) 8 - 23 mg/dL   Creatinine, Ser 0.88 0.44 - 1.00 mg/dL   Calcium 9.7 8.9 - 10.3 mg/dL   Total Protein 8.3 (H) 6.5 - 8.1 g/dL   Albumin 4.4 3.5 - 5.0 g/dL   AST 32 15 - 41 U/L   ALT 37 0 - 44 U/L   Alkaline Phosphatase 127 (H) 38 - 126 U/L   Total Bilirubin 0.5 0.3 - 1.2 mg/dL   GFR, Estimated >60 >60 mL/min    Comment: (NOTE) Calculated using the CKD-EPI Creatinine Equation (2021)    Anion gap 7 5 - 15    Comment: Performed at [facility], [address] [place], [place] [address]  CBC with Differential/Platelet     Status: None   Collection Time: 12/24/21  1:27 PM  Result Value Ref Range   WBC 6.3 4.0 - 10.5 K/uL   RBC 4.37 3.87 - 5.11 MIL/uL   Hemoglobin 13.4 12.0 - 15.0 g/dL   HCT 39.8 36.0 - 46.0 %   MCV 91.1 80.0 - 100.0 fL   MCH 30.7 26.0 - 34.0 pg   MCHC 33.7 30.0 - 36.0 g/dL   RDW 13.3 11.5 - 15.5 %   Platelets 298 150 - 400 K/uL   nRBC 0.0 0.0 - 0.2 %   Neutrophils Relative % 65 %   Neutro Abs 4.1 1.7 - 7.7 K/uL   Lymphocytes Relative 27 %   Lymphs Abs 1.7 0.7 - 4.0 K/uL   Monocytes Relative 7 %  Monocytes Absolute 0.4 0.1 - 1.0 K/uL   Eosinophils Relative 1 %   Eosinophils Absolute 0.1 0.0 - 0.5 K/uL   Basophils Relative 0 %   Basophils Absolute 0.0 0.0 - 0.1 K/uL   Immature Granulocytes 0 %   Abs Immature Granulocytes 0.02 0.00 - 0.07 K/uL    Comment: Performed at [facility], [address] [place], [place] [address]   Objective  Body mass index is 25.35 kg/m. Wt Readings from Last 3 Encounters:  01/12/22 129 lb 12.8 oz (58.9 kg)  12/04/21 130 lb 12.8 oz (59.3 kg)  10/09/21 127 lb 1.6 oz (57.7 kg)   Temp Readings from Last 3 Encounters:  01/12/22 98.3 F (36.8 C) (Oral)  09/23/21 97.9 F (36.6 C) (Tympanic)  09/09/21 97.9 F (36.6 C) (Oral)   BP Readings from Last 3 Encounters:  01/12/22 122/64  09/23/21 (!) 145/72  09/09/21 129/62   Pulse Readings from  Last 3 Encounters:  01/12/22 66  09/23/21 98  09/09/21 81    Physical Exam Vitals and nursing note reviewed.  Constitutional:      Appearance: Normal appearance. She is well-developed and well-groomed.  HENT:     Head: Normocephalic and atraumatic.  Eyes:     Conjunctiva/sclera: Conjunctivae normal.     Pupils: Pupils are equal, round, and reactive to light.  Cardiovascular:     Rate and Rhythm: Normal rate and regular rhythm.     Heart sounds: Normal heart sounds. No murmur heard. Pulmonary:     Effort: Pulmonary effort is normal.     Breath sounds: Normal breath sounds.  Abdominal:     General: Abdomen is flat. Bowel sounds are normal.     Tenderness: There is no abdominal tenderness.  Musculoskeletal:        General: No tenderness.  Skin:    General: Skin is warm and dry.  Neurological:     General: No focal deficit present.     Mental Status: She is alert and oriented to person, place, and time. Mental status is at baseline.     Cranial Nerves: Cranial nerves 2-12 are intact.     Motor: Motor function is intact.     Coordination: Coordination is intact.     Gait: Gait is intact.  Psychiatric:        Attention and Perception: Attention and perception normal.        Mood and Affect: Mood and affect normal.        Speech: Speech normal.        Behavior: Behavior normal. Behavior is cooperative.        Thought Content: Thought content normal.        Cognition and Memory: Cognition and memory normal.        Judgment: Judgment normal.     Assessment  Plan  Depression, recurrent ([place]) - Plan: citalopram (CELEXA) 10 MG tablet titrate up if needed  Phq 9 score 12 today   GAD 7 score 3 today  MGUS (monoclonal gammopathy of unknown significance) F/u Dr. Janese Todd   Screening for colon cancer - Plan: Ambulatory referral to Gastroenterology  Abnormality of heart beat - Plan: EKG 12-Lead today  Short pr and borderline rhythem some avl does not appear like there is a p wave  refer to cardiology    HM sch fasting labs had 07/14/21 Flu shot utd Tdap had 2017/2018 prevnar 13  07/09/21 consider prevnar 20 in the future  shingrix had 09/07/16 and 02/23/17 [place]  covid vx utd   Pap had 2015   mammo 07/24/2018 normal and had [place] right breast pain negative  -referred for mammo 07/2019, 10/12/21 negative ordered 10/2022     Colonoscopy 08/25/17 diverticulosis due Dr. Allen Todd   referred today  DEXA 07/19/2018 T score -2.3 with high FRAX score see above [facility] pending approval  -rec D3 1000 IU daily  A1C prediabetes  --pt wanted referral for nutrition counseling never went due to pandemic   Skin derm [facility] Provider: Dr. Olivia Mackie McLean-Scocuzza-Internal Medicine

## 2022-01-12 NOTE — Patient Instructions (Addendum)
Consider flu shot for above 65  Dr. Allen Norris   Phone Fax E-mail Address  [phone] Not available [place] [id number]     Specialties     Gastroenterology           Citalopram Tablets What is this medication? CITALOPRAM (sye TAL oh pram) treats depression. It increases the amount of serotonin in the brain, a hormone that helps regulate mood. It belongs to a group of medications called SSRIs. This medicine may be used for other purposes; ask your health care provider or pharmacist if you have questions. COMMON BRAND NAME(S): Celexa What should I tell my care team before I take this medication? They need to know if you have any of these conditions: Bipolar disorder or a family history of bipolar disorder Bleeding disorders Glaucoma Heart disease History of irregular heartbeat Kidney disease Liver disease Low levels of magnesium or potassium in the blood Receiving electroconvulsive therapy Seizures Suicidal thoughts, plans, or attempt; a previous suicide attempt by you or a family member Take medications that treat or prevent blood clots Thyroid disease An unusual or allergic reaction to citalopram, escitalopram, other medications, foods, dyes, or preservatives Pregnant or trying to become pregnant Breast-feeding How should I use this medication? Take this medication by mouth with a glass of water. Follow the directions on the prescription label. You can take it with or without food. Take your medication at regular intervals. Do not take your medication more often than directed. Do not stop taking this medication suddenly except upon the advice of your care team. Stopping this medication too quickly may cause serious side effects or your condition may worsen. A special MedGuide will be given to you by the pharmacist with each prescription and refill. Be sure to read this information carefully each time. Talk to your care team about the use of this  medication in children. Special care may be needed. Patients over 40 years old may have a stronger reaction and need a smaller dose. Overdosage: If you think you have taken too much of this medicine contact a poison control center or emergency room at once. NOTE: This medicine is only for you. Do not share this medicine with others. What if I miss a dose? If you miss a dose, take it as soon as you can. If it is almost time for your next dose, take only that dose. Do not take double or extra doses. What may interact with this medication? Do not take this medication with any of the following: Certain medications for fungal infections like fluconazole, itraconazole, ketoconazole, posaconazole, voriconazole Cisapride Dronedarone Escitalopram Linezolid MAOIs like Carbex, Eldepryl, Marplan, Nardil, and Parnate Methylene blue (injected into a vein) Pimozide Thioridazine This medication may also interact with the following: Alcohol Amphetamines Aspirin and aspirin-like medications Carbamazepine Certain medications for depression, anxiety, or psychotic disturbances Certain medications for infections like chloroquine, clarithromycin, erythromycin, furazolidone, isoniazid, pentamidine Certain medications for migraine headaches like almotriptan, eletriptan, frovatriptan, naratriptan, rizatriptan, sumatriptan, zolmitriptan Certain medications for sleep Certain medications that treat or prevent blood clots like dalteparin, enoxaparin, warfarin Cimetidine Diuretics Dofetilide Fentanyl Lithium Methadone Metoprolol NSAIDs, medications for pain and inflammation, like ibuprofen or naproxen Omeprazole Other medications that prolong the QT interval (cause an abnormal heart rhythm) Procarbazine Rasagiline Supplements like St. John's wort, kava kava, valerian Tramadol Tryptophan Ziprasidone This list may not describe all possible interactions. Give your health care provider a list of all the  medicines, herbs, non-prescription drugs, or dietary  supplements you use. Also tell them if you smoke, drink alcohol, or use illegal drugs. Some items may interact with your medicine. What should I watch for while using this medication? Tell your care team if your symptoms do not get better or if they get worse. Visit your care team for regular checks on your progress. Because it may take several weeks to see the full effects of this medication, it is important to continue your treatment as prescribed. Watch for new or worsening thoughts of suicide or depression. This includes sudden changes in mood, behavior, or thoughts. These changes can happen at any time but are more common in the beginning of treatment or after a change in dose. Call your care team right away if you experience these thoughts or worsening depression. Manic episodes may happen in patients with bipolar disorder who take this medication. Watch for changes in feelings or behaviors such as feeling anxious, nervous, agitated, panicky, irritable, hostile, aggressive, impulsive, severely restless, overly excited and hyperactive, or trouble sleeping. These symptoms can happen at anytime but are more common in the beginning of treatment or after a change in dose. Call you care team right away if you notice any of these symptoms. You may get drowsy or dizzy. Do not drive, use machinery, or do anything that needs mental alertness until you know how this medication affects you. Do not stand or sit up quickly, especially if you are an older patient. This reduces the risk of dizzy or fainting spells. Alcohol may interfere with the effect of this medication. Avoid alcoholic drinks. Your mouth may get dry. Chewing sugarless gum or sucking hard candy, and drinking plenty of water may help. Contact your care team if the problem does not go away or is severe. What side effects may I notice from receiving this medication? Side effects that you should report  to your care team as soon as possible: Allergic reactions--skin rash, itching, hives, swelling of the face, lips, tongue, or throat Bleeding--bloody or black, tar-like stools, red or dark brown urine, vomiting blood or brown material that looks like coffee grounds, small, red or purple spots on skin, unusual bleeding or bruising Heart rhythm changes--fast or irregular heartbeat, dizziness, feeling faint or lightheaded, chest pain, trouble breathing Low sodium level--muscle weakness, fatigue, dizziness, headache, confusion Serotonin syndrome--irritability, confusion, fast or irregular heartbeat, muscle stiffness, twitching muscles, sweating, high fever, seizure, chills, vomiting, diarrhea Sudden eye pain or change in vision such as blurry vision, seeing halos around lights, vision loss Thoughts of suicide or self-harm, worsening mood, feelings of depression Side effects that usually do not require medical attention (report to your care team if they continue or are bothersome): Change in sex drive or performance Diarrhea Dry mouth Excessive sweating Nausea Tremors or shaking Upset stomach This list may not describe all possible side effects. Call your doctor for medical advice about side effects. You may report side effects to FDA at 1-800-FDA-1088. Where should I keep my medication? Keep out of reach of children and pets. Store at room temperature between 15 and 30 degrees C (59 and 86 degrees F). Throw away any unused medication after the expiration date. NOTE: This sheet is a summary. It may not cover all possible information. If you have questions about this medicine, talk to your doctor, pharmacist, or health care provider.  2023 Elsevier/Gold Standard (2007-06-17 00:00:00)  Managing Depression, Adult Depression is a mental health condition that affects your thoughts, feelings, and actions. Being diagnosed with depression can bring  you relief if you did not know why you have felt or  behaved a certain way. It could also leave you feeling overwhelmed with uncertainty about your future. Preparing yourself to manage your symptoms can help you feel more positive about your future. How to manage lifestyle changes Managing stress  Stress is your body's reaction to life changes and events, both good and bad. Stress can add to your feelings of depression. Learning to manage your stress can help lessen your feelings of depression. Try some of the following approaches to reducing your stress (stress reduction techniques): Listen to music that you enjoy and that inspires you. Try using a meditation app or take a meditation class. Develop a practice that helps you connect with your spiritual self. Walk in nature, pray, or go to a place of worship. Do some deep breathing. To do this, inhale slowly through your nose. Pause at the top of your inhale for a few seconds and then exhale slowly, letting your muscles relax. Practice yoga to help relax and work your muscles. Choose a stress reduction technique that suits your lifestyle and personality. These techniques take time and practice to develop. Set aside 5-15 minutes a day to do them. Therapists can offer training in these techniques. Other things you can do to manage stress include: Keeping a stress diary. Knowing your limits and saying no when you think something is too much. Paying attention to how you react to certain situations. You may not be able to control everything, but you can change your reaction. Adding humor to your life by watching funny films or TV shows. Making time for activities that you enjoy and that relax you.  Medicines Medicines, such as antidepressants, are often a part of treatment for depression. Talk with your pharmacist or health care provider about all the medicines, supplements, and herbal products that you take, their possible side effects, and what medicines and other products are safe to take  together. Make sure to report any side effects you may have to your health care provider. Relationships Your health care provider may suggest family therapy, couples therapy, or individual therapy as part of your treatment. How to recognize changes Everyone responds differently to treatment for depression. As you recover from depression, you may start to: Have more interest in doing activities. Feel less hopeless. Have more energy. Overeat less often, or have a better appetite. Have better mental focus. It is important to recognize if your depression is not getting better or is getting worse. The symptoms you had in the beginning may return, such as: Tiredness (fatigue) or low energy. Eating too much or too little. Sleeping too much or too little. Feeling restless, agitated, or hopeless. Trouble focusing or making decisions. Unexplained physical complaints. Feeling irritable, angry, or aggressive. If you or your family members notice these symptoms coming back, let your health care provider know right away. Follow these instructions at home: Activity  Try to get some form of exercise each day, such as walking, biking, swimming, or lifting weights. Practice stress reduction techniques. Engage your mind by taking a class or doing some volunteer work. Lifestyle Get the right amount and quality of sleep. Cut down on using caffeine, tobacco, alcohol, and other potentially harmful substances. Eat a healthy diet that includes plenty of vegetables, fruits, whole grains, low-fat dairy products, and lean protein. Do not eat a lot of foods that are high in solid fats, added sugars, or salt (sodium). General instructions Take over-the-counter and prescription medicines  only as told by your health care provider. Keep all follow-up visits as told by your health care provider. This is important. Where to find support Talking to others  Friends and family members can be sources of support and  guidance. Talk to trusted friends or family members about your condition. Explain your symptoms to them, and let them know that you are working with a health care provider to treat your depression. Tell friends and family members how they also can be helpful. Finances Find appropriate mental health providers that fit with your financial situation. Talk with your health care provider about options to get reduced prices on your medicines. Where to find more information You can find support in your area from: Anxiety and Depression Association of America (ADAA): www.adaa.org Mental Health America: www.mentalhealthamerica.net [employer] on Mental Illness: www.nami.org Contact a health care provider if: You stop taking your antidepressant medicines, and you have any of these symptoms: Nausea. Headache. Light-headedness. Chills and body aches. Not being able to sleep (insomnia). You or your friends and family think your depression is getting worse. Get help right away if: You have thoughts of hurting yourself or others. If you ever feel like you may hurt yourself or others, or have thoughts about taking your own life, get help right away. Go to your nearest emergency department or: Call your local emergency services (911 in the [place] Call a suicide crisis helpline, such as the [facility] at (970) 549-9831 or 988 in the [place]. This is open 24 hours a day in the [place] Text the Crisis Text Line at 2363638405 (in the [place].). Summary If you are diagnosed with depression, preparing yourself to manage your symptoms is a good way to feel positive about your future. Work with your health care provider on a management plan that includes stress reduction techniques, medicines (if applicable), therapy, and healthy lifestyle habits. Keep talking with your health care provider about how your treatment is working. If you have thoughts about taking your own life, call a suicide crisis  helpline or text a crisis text line. This information is not intended to replace advice given to you by your health care provider. Make sure you discuss any questions you have with your health care provider. Document Revised: 11/19/2020 Document Reviewed: 03/07/2019 Elsevier Patient Education  [place].  Major Depressive Disorder, Adult Major depressive disorder is a mental health condition. This disorder affects feelings. It can also affect the body. Symptoms of this condition last most of the day, almost every day, for 2 weeks. This disorder can affect: Relationships. Daily activities, such as work and school. Activities that you normally like to do. What are the causes? The cause of this condition is not known. The disorder is likely caused by a mix of things, including: Your personality, such as being a shy person. Your behavior, or how you act toward others. Your thoughts and feelings. Too much alcohol or drugs. How you react to stress. Health and mental problems that you have had for a long time. Things that hurt you in the past (trauma). Big changes in your life, such as divorce. What increases the risk? The following factors may make you more likely to develop this condition: Having family members with depression. Being a woman. Problems in the family. Low levels of some brain chemicals. Things that caused you pain as a child, especially if you lost a parent or were abused. A lot of stress in your life, such as from: Living  without basic needs of life, such as food and shelter. Being treated poorly because of race, sex, or religion (discrimination). Health and mental problems that you have had for a long time. What are the signs or symptoms? The main symptoms of this condition are: Being sad all the time. Being grouchy all the time. Loss of interest in things and activities. Other symptoms include: Sleeping too much or too little. Eating too much or too  little. Gaining or losing weight, without knowing why. Feeling tired or having low energy. Being restless and weak. Feeling hopeless, worthless, or guilty. Trouble thinking clearly or making decisions. Thoughts of hurting yourself or others, or thoughts of ending your life. Spending a lot of time alone. Inability to complete common tasks of daily life. If you have very bad MDD, you may: Believe things that are not true. Hear, see, taste, or feel things that are not there. Have mild depression that lasts for at least 2 years. Feel very sad and hopeless. Have trouble speaking or moving. How is this treated? This condition may be treated with: Talk therapy. This teaches you to know bad thoughts, feelings, and actions and how to change them. This can also help you to communicate with others. This can be done with members of your family. Medicines. These can be used to treat worry (anxiety), depression, or low levels of chemicals in the brain. Lifestyle changes. You may need to: Limit alcohol use. Limit drug use. Get regular exercise. Get plenty of sleep. Make healthy eating choices. Spend more time outdoors. Brain stimulation. This treatment excites the brain. This is done when symptoms are very bad or have not gotten better with other treatments. Follow these instructions at home: Activity Get regular exercise as told. Spend time outdoors as told. Make time to do the things you enjoy. Find ways to deal with stress. Try to: Meditate. Do deep breathing. Spend time in nature. Keep a journal. Return to your normal activities as told by your doctor. Ask your doctor what activities are safe for you. Alcohol and drug use If you drink alcohol: Limit how much you use to: 0-1 drink a day for women. 0-2 drinks a day for men. Be aware of how much alcohol is in your drink. In the [place] one drink equals one 12 oz bottle of beer (355 mL), one 5 oz glass of wine (148 mL), or one 1 oz glass  of hard liquor (44 mL). Talk to your doctor about: Alcohol use. Alcohol can affect some medicines. Any drug use. General instructions  Take over-the-counter and prescription medicines and herbal preparations only as told by your doctor. Eat a healthy diet. Get a lot of sleep. Think about joining a support group. Your doctor may be able to suggest one. Keep all follow-up visits as told by your doctor. This is important. Where to find more information: [employer] on Mental Illness: www.nami.[place]: https://carter.com/ American Psychiatric Association: www.psychiatry.org/patients-families/ Contact a doctor if: Your symptoms get worse. You get new symptoms. Get help right away if: You hurt yourself. You have serious thoughts about hurting yourself or others. You see, hear, taste, smell, or feel things that are not there. If you ever feel like you may hurt yourself or others, or have thoughts about taking your own life, get help right away. Go to your nearest emergency department or: Call your local emergency services (911 in the [place] Call a suicide crisis helpline, such as the National Suicide Prevention  [facility] at (845)634-2035 or 988 in the [place]. This is open 24 hours a day in the [place] Text the Crisis Text Line at 613-697-2066 (in the [place].). Summary Major depressive disorder is a mental health condition. This disorder affects feelings. Symptoms of this condition last most of the day, almost every day, for 2 weeks. The symptoms of this disorder can cause problems with relationships and with daily activities. There are treatments and support for people who get this disorder. You may need more than one type of treatment. Get help right away if you have serious thoughts about hurting yourself or others. This information is not intended to replace advice given to you by your health care provider. Make sure you discuss any questions you have with your  health care provider. Document Revised: 11/19/2020 Document Reviewed: 04/07/2019 Elsevier Patient Education  [place].

## 2022-01-13 ENCOUNTER — Telehealth: Payer: Self-pay

## 2022-01-13 ENCOUNTER — Other Ambulatory Visit: Payer: Self-pay

## 2022-01-13 DIAGNOSIS — Z86010 Personal history of colonic polyps: Secondary | ICD-10-CM

## 2022-01-13 MED ORDER — NA SULFATE-K SULFATE-MG SULF 17.5-3.13-1.6 GM/177ML PO SOLN: 1.0000 | Freq: Once | ORAL | 0 refills | Status: AC

## 2022-01-13 NOTE — Telephone Encounter (Signed)
Gastroenterology Pre-Procedure Review  Request Date: 02/16/22 Requesting Physician: Dr. Allen Norris  PATIENT REVIEW QUESTIONS: The patient responded to the following health history questions as indicated:    1. Are you having any GI issues? no 2. Do you have a personal history of Polyps? yes (08/25/17 performed by Dr. Allen Norris) 3. Do you have a family history of Colon Cancer or Polyps? no 4. Diabetes Mellitus? no 5. Joint replacements in the past 12 months?no 6. Major health problems in the past 3 months?no 7. Any artificial heart valves, MVP, or defibrillator?no    MEDICATIONS & ALLERGIES:    Patient reports the following regarding taking any anticoagulation/antiplatelet therapy:   Plavix, Coumadin, Eliquis, Xarelto, Lovenox, Pradaxa, Brilinta, or Effient? no Aspirin? no  Patient confirms/reports the following medications:  Current Outpatient Medications  Medication Sig Dispense Refill   acetaminophen (TYLENOL) 325 MG tablet Take 650 mg by mouth every 6 (six) hours as needed.     acidophilus (RISAQUAD) CAPS capsule Take by mouth daily.     aspirin-acetaminophen-caffeine (EXCEDRIN MIGRAINE) 250-250-65 MG tablet Take by mouth every 6 (six) hours as needed for headache.     b complex vitamins capsule 1 capsule.     Calcium Citrate (CITRACAL PO) Take 1 capsule by mouth daily.     cetirizine-pseudoephedrine (ZYRTEC-D) 5-120 MG tablet Take 1 tablet by mouth daily.     Cholecalciferol (VITAMIN D3) 25 MCG (1000 UT) CAPS Take 1 capsule by mouth daily.     citalopram (CELEXA) 10 MG tablet Take 1 tablet (10 mg total) by mouth daily. 90 tablet 3   Cranberry 1000 MG CAPS Take 1 capsule by mouth daily.     dextromethorphan-guaiFENesin (MUCINEX DM) 30-600 MG 12hr tablet Take 1 tablet by mouth 2 (two) times daily.     fluticasone (FLONASE) 50 MCG/ACT nasal spray Place 2 sprays into both nostrils daily.  0   magnesium chloride (SLOW-MAG) 64 MG TBEC SR tablet Take 1 tablet by mouth daily.     MELATONIN PO  Take 6 mg by mouth at bedtime.     Misc Natural Products (NEURIVA PO) Take by mouth. (Patient not taking: Reported on 09/23/2021)     montelukast (SINGULAIR) 10 MG tablet Take 10 mg by mouth at bedtime.  0   Multiple Vitamin (MULTIVITAMIN) tablet Take 1 tablet by mouth daily.     pravastatin (PRAVACHOL) 10 MG tablet Take 1 tablet (10 mg total) by mouth daily. 90 tablet 3   No current facility-administered medications for this visit.    Patient confirms/reports the following allergies:  No Known Allergies  No orders of the defined types were placed in this encounter.   AUTHORIZATION INFORMATION Primary Insurance: 1D#: Group #:  Secondary Insurance: 1D#: Group #:  SCHEDULE INFORMATION: Date: 02/16/22 Time: Location: [facility]

## 2022-01-18 ENCOUNTER — Ambulatory Visit (INDEPENDENT_AMBULATORY_CARE_PROVIDER_SITE_OTHER): Payer: Medicare Other

## 2022-01-18 VITALS — BP 136/78 | HR 81 | Temp 97.3°F | Ht 60.0 in | Wt 131.0 lb

## 2022-01-18 DIAGNOSIS — Z0000 Encounter for general adult medical examination without abnormal findings: Secondary | ICD-10-CM

## 2022-01-18 NOTE — Progress Notes (Unsigned)
Cardiology Office Note  Date:  01/19/2022   ID:  IVAL Todd, DOB [birth year], MRN [id number]  PCP:  McLean-Scocuzza, Jamie Glow, MD   Chief Complaint  Patient presents with   New Patient (Initial Visit)    Ref by Dr. Terese Todd for abnormal EKG. Patient c/o feeling exhausted more than usual & has shortness of breath with over exertion. Medications reviewed by the patient verbally.     HPI:  [name] is a 81 year old woman with past medical history of Depression Prediabetes Who presents by referral from Dr. 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 last week and, felt good with only activity  Some stress: travelling to [place] sister, dreads travelling  Not taking her pravastatin, found in her car, has not been taking it past several months Total cholesterol has jumped  EKG reviewed from primary care January 12, 2022 Normal sinus rhythm, no indication of old inferior MI, PR interval within acceptable range  EKG personally reviewed by myself on todays visit NSR rate 85 bpm no St or T wave changes   PMH:   has a past medical history of Basal cell carcinoma (04/23/2014), Cat scratch fever, Chronic fatigue, Chronic migraine, Chronic sinusitis, Complication of anesthesia, COVID-19, Depression, EBV infection, Endometriosis, GERD (gastroesophageal reflux disease), Hemochromatosis, Hyperlipidemia, Measles, Miscarriage, Osteoporosis, Prediabetes, Pyloric stenosis, Skin cancer, Squamous cell carcinoma of skin (07/19/2008), Type A blood, Rh negative, Ulcerative colitis ([place]), and UTI (urinary tract infection).  PSH:    Past Surgical History:  Procedure Laterality Date   CATARACT EXTRACTION      b/l   COLONOSCOPY WITH PROPOFOL N/A 08/25/2017   Procedure: COLONOSCOPY WITH PROPOFOL;  Surgeon: Lucilla Lame, MD;  Location: [facility];  Service: Endoscopy;  Laterality: N/A;  wants to be last patient   OTHER SURGICAL HISTORY     laparoscopy several, D&Cs 1970s/1980s     Current Outpatient Medications  Medication Sig Dispense Refill   acetaminophen (TYLENOL) 325 MG tablet Take 650 mg by mouth every 6 (six) hours as needed.     acidophilus (RISAQUAD) CAPS capsule Take by mouth daily.     aspirin-acetaminophen-caffeine (EXCEDRIN MIGRAINE) 250-250-65 MG tablet Take by mouth every 6 (six) hours as needed for headache.     azelastine (ASTELIN) 0.1 % nasal spray SMARTSIG:1-2 Spray(s) Both Nares Twice Daily     b complex vitamins capsule 1 capsule.     Calcium Citrate (CITRACAL PO) Take 1 capsule by mouth daily.     cetirizine-pseudoephedrine (ZYRTEC-D) 5-120 MG tablet Take 1 tablet by mouth daily.     Cholecalciferol (VITAMIN D3) 25 MCG (1000 UT) CAPS Take 1 capsule by mouth daily.     Cranberry 1000 MG CAPS Take 1 capsule by mouth daily.     dextromethorphan-guaiFENesin (MUCINEX DM) 30-600 MG 12hr tablet Take 1 tablet by mouth 2 (two) times daily.     esomeprazole (NEXIUM) 20 MG capsule Take 20 mg by mouth daily at 12 noon.     fluticasone (FLONASE) 50 MCG/ACT nasal spray Place 2 sprays into both nostrils daily.  0   magnesium chloride (SLOW-MAG) 64 MG TBEC SR tablet Take 1 tablet by mouth daily.     MELATONIN PO Take 6 mg  by mouth at bedtime.     Misc Natural Products (NEURIVA PO) Take by mouth.     montelukast (SINGULAIR) 10 MG tablet Take 10 mg by mouth at bedtime.  0   Multiple Vitamin (MULTIVITAMIN) tablet Take 1 tablet by mouth daily.     pravastatin (PRAVACHOL) 10 MG tablet Take 1 tablet (10 mg total) by mouth daily. 90 tablet 3   citalopram (CELEXA) 10 MG tablet Take 1 tablet (10 mg total) by mouth daily. (Patient not taking: Reported on 01/19/2022) 90 tablet 3   No current  facility-administered medications for this visit.     Allergies:   Patient has no known allergies.   Social History:  The patient  reports that she has never smoked. She has never used smokeless tobacco. She reports current alcohol use. She reports that she does not use drugs.   Family History:   family history includes Blindness in her mother; Deafness in her mother; Dementia in her mother; Diabetes in an other family member; Heart disease in her mother.    Review of Systems: Review of Systems  Constitutional: Negative.   HENT: Negative.    Respiratory: Negative.    Cardiovascular: Negative.   Gastrointestinal: Negative.   Musculoskeletal: Negative.   Neurological: Negative.   Psychiatric/Behavioral: Negative.    All other systems reviewed and are negative.   PHYSICAL EXAM: VS:  BP 120/74 (BP Location: Right Arm, Patient Position: Sitting, Cuff Size: Normal)   Pulse 85   Ht 5' (1.524 m)   Wt 130 lb 8 oz (59.2 kg)   SpO2 99%   BMI 25.49 kg/m  , BMI Body mass index is 25.49 kg/m. GEN: Well nourished, well developed, in no acute distress HEENT: normal Neck: no JVD, carotid bruits, or masses Cardiac: RRR; no murmurs, rubs, or gallops,no edema  Respiratory:  clear to auscultation bilaterally, normal work of breathing GI: soft, nontender, nondistended, + BS MS: no deformity or atrophy Skin: warm and dry, no rash Neuro:  Strength and sensation are intact Psych: euthymic mood, full affect   Recent Labs: 07/14/2021: TSH 3.92 12/24/2021: ALT 37; BUN 31; Creatinine, Ser 0.88; Hemoglobin 13.4; Platelets 298; Potassium 4.1; Sodium 139    Lipid Panel Lab Results  Component Value Date   CHOL 249 (H) 07/14/2021   HDL 69.50 07/14/2021   LDLCALC 155 (H) 07/14/2021   TRIG 121.0 07/14/2021      Wt Readings from Last 3 Encounters:  01/19/22 130 lb 8 oz (59.2 kg)  01/18/22 131 lb (59.4 kg)  01/12/22 129 lb 12.8 oz (58.9 kg)     ASSESSMENT AND PLAN:  Problem List Items  Addressed This Visit     Depression, recurrent ([place])   Prediabetes   Other Visit Diagnoses     Abnormal EKG    -  Primary      Abnormal EKG Short PR likely of little clinical concern She is asymptomatic Rare APCs and PVCs on prior EKGs, she is asymptomatic Ectopy appreciated on exam, she did not appreciate them No indication for beta-blocker or Zio monitor as she is not having symptoms Echocardiogram ordered for baseline study  Hyperlipidemia Recommend she take her pravastatin  Chronic fatigue Reports longstanding history of exhaustion, chronic fatigue Recommend she restart walking program, walking her dog Recommend good sleep hygiene    Total encounter time more than 50 minutes  Greater than 50% was spent in counseling and coordination of care with the patient    Signed, Esmond Plants, M.D., Ph.D. [facility]  [place], [place]

## 2022-01-18 NOTE — Progress Notes (Signed)
Subjective:   Jamie Todd is a 81 y.o. female who presents for an Initial Medicare Annual Wellness Visit.  Review of Systems    No ROS.  Medicare Wellness    Cardiac Risk Factors include: advanced age (>77mn, >>27women)     Objective:    Today's Vitals   01/18/22 1454  BP: 136/78  Pulse: 81  Temp: (!) 97.3 F (36.3 C)  Weight: 131 lb (59.4 kg)  Height: 5' (1.524 m)   Body mass index is 25.58 kg/m.     01/18/2022    2:58 PM 09/23/2021    1:03 PM 09/09/2021    8:00 AM 09/01/2021   11:02 AM 08/26/2021    3:00 PM 08/14/2021    2:03 PM 02/24/2021   11:06 AM  Advanced Directives  Does Patient Have a Medical Advance Directive? No No No No No No Yes  Type of ATransport plannerLiving will  Would patient like information on creating a medical advance directive? No - Patient declined No - Patient declined   Yes (MAU/Ambulatory/Procedural Areas - Information given) No - Patient declined     Current Medications (verified) Outpatient Encounter Medications as of 01/18/2022  Medication Sig   acetaminophen (TYLENOL) 325 MG tablet Take 650 mg by mouth every 6 (six) hours as needed.   acidophilus (RISAQUAD) CAPS capsule Take by mouth daily.   aspirin-acetaminophen-caffeine (EXCEDRIN MIGRAINE) 250-250-65 MG tablet Take by mouth every 6 (six) hours as needed for headache.   b complex vitamins capsule 1 capsule.   Calcium Citrate (CITRACAL PO) Take 1 capsule by mouth daily.   cetirizine-pseudoephedrine (ZYRTEC-D) 5-120 MG tablet Take 1 tablet by mouth daily.   Cholecalciferol (VITAMIN D3) 25 MCG (1000 UT) CAPS Take 1 capsule by mouth daily.   citalopram (CELEXA) 10 MG tablet Take 1 tablet (10 mg total) by mouth daily.   Cranberry 1000 MG CAPS Take 1 capsule by mouth daily.   dextromethorphan-guaiFENesin (MUCINEX DM) 30-600 MG 12hr tablet Take 1 tablet by mouth 2 (two) times daily.   fluticasone (FLONASE) 50 MCG/ACT nasal spray Place 2 sprays into both  nostrils daily.   magnesium chloride (SLOW-MAG) 64 MG TBEC SR tablet Take 1 tablet by mouth daily.   MELATONIN PO Take 6 mg by mouth at bedtime.   Misc Natural Products (NEURIVA PO) Take by mouth. (Patient not taking: Reported on 09/23/2021)   montelukast (SINGULAIR) 10 MG tablet Take 10 mg by mouth at bedtime.   Multiple Vitamin (MULTIVITAMIN) tablet Take 1 tablet by mouth daily.   pravastatin (PRAVACHOL) 10 MG tablet Take 1 tablet (10 mg total) by mouth daily.   No facility-administered encounter medications on file as of 01/18/2022.    Allergies (verified) Patient has no known allergies.   History: Past Medical History:  Diagnosis Date   Basal cell carcinoma 04/23/2014   Right frontal scalp within hair line. Nodular. Excised 06/18/2014, margins free   Cat scratch fever    Chronic fatigue    Chronic migraine    as of 2020 resolved and no issues w/in the last 20 years per pt    Chronic sinusitis    Complication of anesthesia    slow to wake up   COVID-19    04/2021   Depression    1975 to 1995 professional help sought   EBV infection    Endometriosis    GERD (gastroesophageal reflux disease)    Hemochromatosis    Hyperlipidemia  Measles    Miscarriage    Osteoporosis    Prediabetes    Pyloric stenosis    Skin cancer    basal and squamous cell    Squamous cell carcinoma of skin 07/19/2008   Right lat. lower leg. SCCis   Type A blood, Rh negative    Ulcerative colitis ([place])    1975 to 1995 tx'ed with medical care    UTI (urinary tract infection)    Past Surgical History:  Procedure Laterality Date   CATARACT EXTRACTION     b/l   COLONOSCOPY WITH PROPOFOL N/A 08/25/2017   Procedure: COLONOSCOPY WITH PROPOFOL;  Surgeon: Lucilla Lame, MD;  Location: [facility];  Service: Endoscopy;  Laterality: N/A;  wants to be last patient   OTHER SURGICAL HISTORY     laparoscopy several, D&Cs 1970s/1980s    Family History  Problem Relation Age of Onset   Dementia  Mother    Heart disease Mother        died at 21   Deafness Mother    Blindness Mother    Diabetes Other        both sides of family    Social History   Socioeconomic History   Marital status: Divorced    Spouse name: Not on file   Number of children: Not on file   Years of education: Not on file   Highest education level: Not on file  Occupational History   Not on file  Tobacco Use   Smoking status: Never   Smokeless tobacco: Never  Vaping Use   Vaping Use: Never used  Substance and Sexual Activity   Alcohol use: Yes    Comment: rarely   Drug use: No   Sexual activity: Never  Other Topics Concern   Not on file  Social History Narrative   Grad school    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 she was legally blind and deaf and heart problems and dementia x 8 years but mom died    She likes animals and has therapy dogs    From [place]   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, hospital in 2017    Social Determinants of Health   Financial Resource Strain: Low Risk  (01/18/2022)   Overall Financial Resource Strain (CARDIA)    Difficulty of Paying Living Expenses: Not hard at all  Food Insecurity: No Food Insecurity (01/18/2022)   Hunger Vital Sign    Worried About Running Out of Food in the Last Year: Never true    Ran Out of Food in the Last Year: Never true  Transportation Needs: No Transportation Needs (01/18/2022)   PRAPARE - Hydrologist (Medical): No    Lack of Transportation (Non-Medical): No  Physical Activity: Not on file  Stress: No Stress Concern Present (01/18/2022)   [place]    Feeling of Stress : Only a little  Social Connections: Unknown (01/18/2022)   Social Connection and Isolation Panel [NHANES]    Frequency of Communication with Friends and Family: More than three times a week    Frequency of Social Gatherings with Friends and Family: More than three times a week     Attends Religious Services: Not on file    Active Member of Clubs or Organizations: Not on file    Attends Archivist Meetings: Not on file    Marital Status: Not on file    Tobacco Counseling  Counseling given: Not Answered   Clinical Intake:  Pre-visit preparation completed: Yes        Diabetes: No  How often do you need to have someone help you when you read instructions, pamphlets, or other written materials from your doctor or pharmacy?: 1 - Never  Interpreter Needed?: No      Activities of Daily Living    01/18/2022    3:01 PM 09/09/2021    7:56 AM  In your present state of health, do you have any difficulty performing the following activities:  Hearing? 0 0  Vision? 0 0  Difficulty concentrating or making decisions? 0 0  Walking or climbing stairs? 0 0  Dressing or bathing? 0 0  Doing errands, shopping? 0   Preparing Food and eating ? N   Using the Toilet? N   In the past six months, have you accidently leaked urine? N   Do you have problems with loss of bowel control? N   Managing your Medications? N   Managing your Finances? N   Housekeeping or managing your Housekeeping? N     Patient Care Team: McLean-Scocuzza, Nino Glow, MD as PCP - General (Internal Medicine) Sindy Guadeloupe, MD as Consulting Physician (Oncology)  Indicate any recent Medical Services you may have received from other than [facility] providers in the past year (date may be approximate).     Assessment:   This is a routine wellness examination for [place].  Virtual Visit via Telephone Note  I connected with  Brihana Quickel Hochberg on 01/18/22 at  2:45 PM EDT by telephone and verified that I am speaking with the correct person using two identifiers.  Location: Patient: home Provider: office Persons participating in the virtual visit: patient/Nurse Health Advisor   I discussed the limitations of performing an evaluation and management service by telehealth. We continued and  completed visit with audio only. Some vital signs may be absent or patient reported.     Hearing/Vision screen Hearing Screening - Comments:: Patient is able to hear conversational tones without difficulty.  No issues reported. Vision Screening - Comments:: Followed by [facility] Up to date with eye exam   Dietary issues and exercise activities discussed: Current Exercise Habits: Home exercise routine, Intensity: Mild Regular diet   Goals Addressed             This Visit's Progress    Increase physical activity       Walk for exercise as tolerated       Depression Screen    01/18/2022    3:02 PM 01/12/2022    2:09 PM 08/26/2021    3:04 PM 07/09/2021    1:35 PM 06/08/2019    3:36 PM  PHQ 2/9 Scores  PHQ - 2 Score 2 4 0 0 0  PHQ- 9 Score 7 12       Fall Risk    01/18/2022    3:00 PM 01/12/2022    2:09 PM 12/04/2021   10:03 AM 10/09/2021   10:05 AM 08/26/2021    3:01 PM  Fall Risk   Falls in the past year?  1 0  1  Comment No falls since last reported 4 days ago   no falls since 07/2021   Number falls in past yr:  0   0  Comment     tripped by dog going down steps  Injury with Fall?  1   0  Risk for fall due to : No Fall Risks  Other (Comment)     Follow up Falls evaluation completed Falls evaluation completed       [place]: Home free of loose throw rugs in walkways, pet beds, electrical cords, etc? Yes Adequate lighting in your home to reduce risk of falls? Yes   ASSISTIVE DEVICES UTILIZED TO PREVENT FALLS: Life alert? No  Use of a cane, walker or w/c? No  Grab bars in the bathroom? No  Shower chair or bench in shower? No  Elevated toilet seat or a handicapped toilet? No   TIMED UP AND GO: Was the test performed? Yes .  Length of time to ambulate 10 feet: 10 sec.   Gait steady and fast without use of assistive device  Cognitive Function:        01/18/2022    3:11 PM  6CIT Screen  What Year? 0 points  What month?  0 points  What time? 0 points  Count back from 20 0 points  Months in reverse 0 points  Repeat phrase 0 points  Total Score 0 points    Immunizations Immunization History  Administered Date(s) Administered   Hepb-cpg 12/28/2018   Influenza-Unspecified 01/08/2017, 06/07/2019, 03/09/2021   Moderna SARS-COV2 Booster Vaccination 04/15/2020, 11/07/2020   PFIZER(Purple Top)SARS-COV-2 Vaccination 07/06/2019, 08/01/2019   Pfizer Covid-19 Vaccine Bivalent Booster 58yr & up 04/25/2021   Pneumococcal Conjugate-13 07/09/2021   Zoster Recombinat (Shingrix) 09/07/2016, 02/23/2017   TDAP status: Due, Education has been provided regarding the importance of this vaccine. Advised may receive this vaccine at local pharmacy or Health Dept. Aware to provide a copy of the vaccination record if obtained from local pharmacy or Health Dept. Verbalized acceptance and understanding.  Screening Tests Health Maintenance  Topic Date Due   COLONOSCOPY (Pts [phone] coverage will need to be confirmed)  02/07/2022 (Originally 08/26/2019)   TETANUS/TDAP  04/09/2022 (Originally 03/24/1960)   COVID-19 Vaccine (4 - Pfizer risk series) 07/22/2022 (Originally 06/20/2021)   INFLUENZA VACCINE  08/08/2022 (Originally 12/08/2021)   Pneumonia Vaccine 79Years old (2 - PPSV23 or PCV20) 07/10/2022   DEXA SCAN  Completed   Hepatitis C Screening  Completed   Zoster Vaccines- Shingrix  Completed   HPV VACCINES  Aged Out   Health Maintenance There are no preventive care reminders to display for this patient.  Colonoscopy- consultation scheduled.   Lung Cancer Screening: (Low Dose CT Chest recommended if Age 79-80ears, 30 pack-year currently smoking OR have quit w/in 15years.) does not qualify.   Vision Screening: Recommended annual ophthalmology exams for early detection of glaucoma and other disorders of the eye.  Dental Screening: Recommended annual dental exams for proper oral hygiene  Community Resource  Referral / Chronic Care Management: CRR required this visit?  No   CCM required this visit?  No      Plan:     I have personally reviewed and noted the following in the patient's chart:   Medical and social history Use of alcohol, tobacco or illicit drugs  Current medications and supplements including opioid prescriptions. Patient is not currently taking opioid prescriptions. Functional ability and status Nutritional status Physical activity Advanced directives List of other physicians Hospitalizations, surgeries, and ER visits in previous 12 months Vitals Screenings to include cognitive, depression, and falls Referrals and appointments  In addition, I have reviewed and discussed with patient certain preventive protocols, quality metrics, and best practice recommendations. A written personalized care plan for preventive services as well as general preventive health recommendations  were provided to patient.     Varney Biles, LPN   [id number]

## 2022-01-18 NOTE — Patient Instructions (Addendum)
[name] , Thank you for taking time to come for your Medicare Wellness Visit. I appreciate your ongoing commitment to your health goals. Please review the following plan we discussed and let me know if I can assist you in the future.   These are the goals we discussed:  Goals      Increase physical activity     Walk for exercise as tolerated        This is a list of the screening recommended for you and due dates:  Health Maintenance  Topic Date Due   Colon Cancer Screening  02/07/2022*   Tetanus Vaccine  04/09/2022*   COVID-19 Vaccine (4 - Pfizer risk series) 07/22/2022*   Flu Shot  08/08/2022*   Pneumonia Vaccine (2 - PPSV23 or PCV20) 07/10/2022   DEXA scan (bone density measurement)  Completed   Hepatitis C Screening: USPSTF Recommendation to screen - Ages 14-79 yo.  Completed   Zoster (Shingles) Vaccine  Completed   HPV Vaccine  Aged Out  *Topic was postponed. The date shown is not the original due date.    Go to local pharmacy for Tdap vaccine  Next appointment: Follow up in one year for your annual wellness visit    Preventive Care 65 Years and Older, Female Preventive care refers to lifestyle choices and visits with your health care provider that can promote health and wellness. What does preventive care include? A yearly physical exam. This is also called an annual well check. Dental exams once or twice a year. Routine eye exams. Ask your health care provider how often you should have your eyes checked. Personal lifestyle choices, including: Daily care of your teeth and gums. Regular physical activity. Eating a healthy diet. Avoiding tobacco and drug use. Limiting alcohol use. Practicing safe sex. Taking low-dose aspirin every day. Taking vitamin and mineral supplements as recommended by your health care provider. What happens during an annual well check? The services and screenings done by your health care provider during your annual well check will depend  on your age, overall health, lifestyle risk factors, and family history of disease. Counseling  Your health care provider may ask you questions about your: Alcohol use. Tobacco use. Drug use. Emotional well-being. Home and relationship well-being. Sexual activity. Eating habits. History of falls. Memory and ability to understand (cognition). Work and work Statistician. Reproductive health. Screening  You may have the following tests or measurements: Height, weight, and BMI. Blood pressure. Lipid and cholesterol levels. These may be checked every 5 years, or more frequently if you are over 76 years old. Skin check. Lung cancer screening. You may have this screening every year starting at age 91 if you have a 30-pack-year history of smoking and currently smoke or have quit within the past 15 years. Fecal occult blood test (FOBT) of the stool. You may have this test every year starting at age 60. Flexible sigmoidoscopy or colonoscopy. You may have a sigmoidoscopy every 5 years or a colonoscopy every 10 years starting at age 59. Hepatitis C blood test. Hepatitis B blood test. Sexually transmitted disease (STD) testing. Diabetes screening. This is done by checking your blood sugar (glucose) after you have not eaten for a while (fasting). You may have this done every 1-3 years. Bone density scan. This is done to screen for osteoporosis. You may have this done starting at age 14. Mammogram. This may be done every 1-2 years. Talk to your health care provider about how often you should have regular  mammograms. Talk with your health care provider about your test results, treatment options, and if necessary, the need for more tests. Vaccines  Your health care provider may recommend certain vaccines, such as: Influenza vaccine. This is recommended every year. Tetanus, diphtheria, and acellular pertussis (Tdap, Td) vaccine. You may need a Td booster every 10 years. Zoster vaccine. You may need  this after age 38. Pneumococcal 13-valent conjugate (PCV13) vaccine. One dose is recommended after age 85. Pneumococcal polysaccharide (PPSV23) vaccine. One dose is recommended after age 47. Talk to your health care provider about which screenings and vaccines you need and how often you need them. This information is not intended to replace advice given to you by your health care provider. Make sure you discuss any questions you have with your health care provider. Document Released: 05/23/2015 Document Revised: 01/14/2016 Document Reviewed: 02/25/2015 Elsevier Interactive Patient Education  2017 [employer] Prevention in the Home Falls can cause injuries. They can happen to people of all ages. There are many things you can do to make your home safe and to help prevent falls. What can I do on the outside of my home? Regularly fix the edges of walkways and driveways and fix any cracks. Remove anything that might make you trip as you walk through a door, such as a raised step or threshold. Trim any bushes or trees on the path to your home. Use bright outdoor lighting. Clear any walking paths of anything that might make someone trip, such as rocks or tools. Regularly check to see if handrails are loose or broken. Make sure that both sides of any steps have handrails. Any raised decks and porches should have guardrails on the edges. Have any leaves, snow, or ice cleared regularly. Use sand or salt on walking paths during winter. Clean up any spills in your garage right away. This includes oil or grease spills. What can I do in the bathroom? Use night lights. Install grab bars by the toilet and in the tub and shower. Do not use towel bars as grab bars. Use non-skid mats or decals in the tub or shower. If you need to sit down in the shower, use a plastic, non-slip stool. Keep the floor dry. Clean up any water that spills on the floor as soon as it happens. Remove soap buildup in the tub  or shower regularly. Attach bath mats securely with double-sided non-slip rug tape. Do not have throw rugs and other things on the floor that can make you trip. What can I do in the bedroom? Use night lights. Make sure that you have a light by your bed that is easy to reach. Do not use any sheets or blankets that are too big for your bed. They should not hang down onto the floor. Have a firm chair that has side arms. You can use this for support while you get dressed. Do not have throw rugs and other things on the floor that can make you trip. What can I do in the kitchen? Clean up any spills right away. Avoid walking on wet floors. Keep items that you use a lot in easy-to-reach places. If you need to reach something above you, use a strong step stool that has a grab bar. Keep electrical cords out of the way. Do not use floor polish or wax that makes floors slippery. If you must use wax, use non-skid floor wax. Do not have throw rugs and other things on the floor that can  make you trip. What can I do with my stairs? Do not leave any items on the stairs. Make sure that there are handrails on both sides of the stairs and use them. Fix handrails that are broken or loose. Make sure that handrails are as long as the stairways. Check any carpeting to make sure that it is firmly attached to the stairs. Fix any carpet that is loose or worn. Avoid having throw rugs at the top or bottom of the stairs. If you do have throw rugs, attach them to the floor with carpet tape. Make sure that you have a light switch at the top of the stairs and the bottom of the stairs. If you do not have them, ask someone to add them for you. What else can I do to help prevent falls? Wear shoes that: Do not have high heels. Have rubber bottoms. Are comfortable and fit you well. Are closed at the toe. Do not wear sandals. If you use a stepladder: Make sure that it is fully opened. Do not climb a closed stepladder. Make  sure that both sides of the stepladder are locked into place. Ask someone to hold it for you, if possible. Clearly mark and make sure that you can see: Any grab bars or handrails. First and last steps. Where the edge of each step is. Use tools that help you move around (mobility aids) if they are needed. These include: Canes. Walkers. Scooters. Crutches. Turn on the lights when you go into a dark area. Replace any light bulbs as soon as they burn out. Set up your furniture so you have a clear path. Avoid moving your furniture around. If any of your floors are uneven, fix them. If there are any pets around you, be aware of where they are. Review your medicines with your doctor. Some medicines can make you feel dizzy. This can increase your chance of falling. Ask your doctor what other things that you can do to help prevent falls. This information is not intended to replace advice given to you by your health care provider. Make sure you discuss any questions you have with your health care provider. Document Released: 02/20/2009 Document Revised: 10/02/2015 Document Reviewed: 05/31/2014 Elsevier Interactive Patient Education  2017 [employer].

## 2022-01-19 ENCOUNTER — Telehealth: Payer: Self-pay | Admitting: Cardiovascular Disease

## 2022-01-19 ENCOUNTER — Ambulatory Visit: Payer: Medicare Other | Attending: Cardiovascular Disease | Admitting: Cardiovascular Disease

## 2022-01-19 ENCOUNTER — Encounter: Payer: Self-pay | Admitting: Cardiovascular Disease

## 2022-01-19 VITALS — BP 120/74 | HR 85 | Ht 60.0 in | Wt 130.5 lb

## 2022-01-19 DIAGNOSIS — R7303 Prediabetes: Secondary | ICD-10-CM | POA: Diagnosis not present

## 2022-01-19 DIAGNOSIS — F339 Major depressive disorder, recurrent, unspecified: Secondary | ICD-10-CM | POA: Diagnosis not present

## 2022-01-19 DIAGNOSIS — R9431 Abnormal electrocardiogram [ECG] [EKG]: Secondary | ICD-10-CM | POA: Diagnosis not present

## 2022-01-19 NOTE — Telephone Encounter (Signed)
Spoke with Dr. Rockey Situ about the question the patient asked below --  Dr. Rockey Situ stated that is would be okay for the patient to fly.   Called patient and made her aware.

## 2022-01-19 NOTE — Telephone Encounter (Signed)
Patient would like to know if it's okay for her to fly to [facility] prior to her getting her Echo done in October?

## 2022-01-19 NOTE — Patient Instructions (Addendum)
Medication Instructions:   No changes  If you need a refill on your cardiac medications before your next appointment, please call your pharmacy.   Lab work:  No new labs needed  Testing/Procedures:  Echocardiogram  Your physician has requested that you have an echocardiogram. Echocardiography is a painless test that uses sound waves to create images of your heart. It provides your doctor with information about the size and shape of your heart and how well your heart's chambers and valves are working. This procedure takes approximately one hour. There are no restrictions for this procedure. Please note; depending on visual quality an IV may need to be placed.    Follow-Up: At [facility], you and your health needs are our priority.  As part of our continuing mission to provide you with exceptional heart care, we have created designated Provider Care Teams.  These Care Teams include your primary Cardiologist (physician) and Advanced Practice Providers (APPs -  Physician Assistants and Nurse Practitioners) who all work together to provide you with the care you need, when you need it.  You will need a follow up appointment as needed  Providers on your designated Care Team:   Murray Hodgkins, NP Christell Faith, PA-C Cadence Kathlen Mody, [place]  COVID-19 Vaccine Information can be found at: [email or web address] For questions related to vaccine distribution or appointments, please email [email or web address] or call [phone].

## 2022-01-22 ENCOUNTER — Telehealth: Payer: Self-pay | Admitting: Internal Medicine

## 2022-01-22 NOTE — Telephone Encounter (Signed)
I do not know the order for this typically we do liver kidneys, vitamin D, B12 if she can tell me the test I will look into it  There is not chronic fatigue test that I know of

## 2022-01-22 NOTE — Telephone Encounter (Signed)
Pt called stating she would like for the provider to fax over a request to have a chronic fatigue syndrome test done at [place] in [facility] Fax number [phone]

## 2022-01-25 NOTE — Telephone Encounter (Signed)
Dr. Elza Rafter

## 2022-01-25 NOTE — Telephone Encounter (Signed)
Chronic fatigue panel from [facility] includes all the labs she has already had liver kidneys, blood counts, iron panel, thyroid   If she has fatigue I would rec she discuss with hematology/oncology   Consider sleep study if snoring   We have done all of the labs typical work up from primary standpoint for fatigue

## 2022-01-25 NOTE — Telephone Encounter (Signed)
S/w pt in regards to chronic fatigue panel. Instructed pt to f/u w/Dr.Rao for fatigue, notified pt that note was to Dr.Rao for her advice. Pt denies sleep study at this time due to no snoring.  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 Pt verbalized understanding to info.

## 2022-01-25 NOTE — Telephone Encounter (Signed)
Chronic fatigue panel from [facility] includes all the labs she has already had liver kidneys, blood counts, iron panel, thyroid    If she has fatigue I would rec she discuss with hematology/oncology    Consider sleep study if snoring    We have done all of the labs typical work up from primary standpoint for fatigue   We could do an echo of her heart and make sure ok? Does she want this?   Also fatigue could be post covid in 04/2021

## 2022-02-05 ENCOUNTER — Encounter: Payer: Self-pay | Admitting: Dietician

## 2022-02-05 ENCOUNTER — Encounter: Payer: Medicare Other | Attending: Internal Medicine | Admitting: Dietician

## 2022-02-05 VITALS — Wt 131.3 lb

## 2022-02-05 DIAGNOSIS — E785 Hyperlipidemia, unspecified: Secondary | ICD-10-CM | POA: Insufficient documentation

## 2022-02-05 DIAGNOSIS — Z6825 Body mass index (BMI) 25.0-25.9, adult: Secondary | ICD-10-CM | POA: Insufficient documentation

## 2022-02-05 DIAGNOSIS — R7303 Prediabetes: Secondary | ICD-10-CM | POA: Diagnosis not present

## 2022-02-05 NOTE — Patient Instructions (Addendum)
Continue to eat something every 3-5 hours during the day especially when awake for several hours.  Keep simple foods on hand such as low sugar fruit cups, yogurts, healthy crackers/ bread peanut butter, low fat cheese, tuna cans or pouches, ready made salads/ salad kits

## 2022-02-05 NOTE — Progress Notes (Signed)
Medical Nutrition Therapy: Visit start time: 2706  end time: 1050  Assessment:  Diagnosis: prediabetes, HLD, overweight Medical history changes: patient reports chronic fatigue Psychosocial issues/ stress concerns: none  Medications, supplement changes: no changes per patient    Current weight: 131.3lbs Height: 5'2" BMI: 25.64  Progress and evaluation:  Patient reports feeling exhausted in recent weeks, decreased appetite. Thinks she is having recurrence of chronic fatigue syndrome which she had in 1980s. Took B12 injections at that time which seemed to help. Wants to visit sister and brother in law (with end stage cancer) but does not feel she can manage the travel due to her severe fatigue. She is considering resuming some BG testing to help with discipline to control diet.     Dietary Intake:  Usual eating pattern includes 2-3 meals and 0 snacks per day. Dining out frequency: 0-1 meals per week.  Breakfast: breakfast or protein drink Snack: none Lunch: protein drink, might skip if sleeping Snack: none Supper: frozen meal, cooked meal at friend's home occasionally, or restaurant meal Snack: none Beverages: water, protein shakes  Physical activity: none, sleeping long hours daily  Intervention:   Nutrition Care Education:  Basic nutrition: reviewed importance of eating at regular intervals and consuming adequate daily nutrition   Weight control: reviewed progress since previous visit prediabetes:  reviewed appropriate meal and snack schedule; simple food choices to have on hand, even at bedside or beside chair for convenience and motivation Hyperlipidemia: appropriate food choices ie lowfat yogurt and fruit vs ice cream  Other intervention notes: Focused discussion on dealing with chronic fatigue and making meals and snacks easy with minimal prep involved; encouraged intake of solid foods and not relying on liquid nutrition for extended period of time.  Updated nutrition  goals with input from patient. No additional MNT follow up scheduled at this time; patient to schedule later if needed after resolution or improvement of fatigue symptoms.  Nutritional Diagnosis:  [place]-2.2 Altered nutrition-related laboratory As related to pre-diabetes, hyperlipidemia.  As evidenced by elevated total cholesterol and LDL, elevated HbA1C.   Education Materials given:  Visit summary with goals/ instructions   Learner/ who was taught:  Patient   Level of understanding: Verbalizes/ demonstrates competency   Demonstrated degree of understanding via:   Teach back Learning barriers: None  Willingness to learn/ readiness for change: Eager, change in progress   Monitoring and Evaluation:  Dietary intake, exercise, BG control, blood lipids, fatigue symptoms, and body weight      follow up: prn

## 2022-02-15 ENCOUNTER — Telehealth: Payer: Self-pay | Admitting: *Deleted

## 2022-02-15 NOTE — Telephone Encounter (Signed)
Patient called office, stating that she needs her colonoscopy cancel because she is sick. Patient was scheduled 02/16/2022. Patient is also going out of town next week and will be gone for a while so she does not want to reschedule yet. Patient will call back at a later time.  Spoken to Latty at [facility] endo unit and procedure have been canceled.

## 2022-02-15 NOTE — Telephone Encounter (Signed)
Pt would like to be called In regards to starting B12 shots

## 2022-02-15 NOTE — Telephone Encounter (Signed)
Latest Reference Range & Units Most Recent 07/14/21 09:15  Vitamin B12 211 - 911 pg/mL 1,127 (H) 07/14/21 09:15 1,127 (H)  (H): Data is abnormally high  Last B12 normal and insurance will not cover B12 shots if not low  Please discuss fatigue with Dr. Janese Banks and sch TOC appt with Dr. Volanda Napoleon

## 2022-02-16 ENCOUNTER — Encounter: Admission: RE | Payer: Self-pay | Source: Home / Self Care

## 2022-02-16 ENCOUNTER — Ambulatory Visit: Admission: RE | Admit: 2022-02-16 | Payer: Medicare Other | Source: Home / Self Care | Admitting: Gastroenterology

## 2022-02-16 SURGERY — COLONOSCOPY WITH PROPOFOL
Anesthesia: General

## 2022-02-16 NOTE — Telephone Encounter (Signed)
Dr. Janese Banks states B12 not indicate as well and fatigue likely not hematologic cause  Inform pt sch appt Dr. Volanda Napoleon if needed

## 2022-02-16 NOTE — Telephone Encounter (Signed)
Pt has been informed and has been booked with Dr. Volanda Napoleon via telephone visit on tomorrow

## 2022-02-17 ENCOUNTER — Telehealth: Payer: Medicare Other | Admitting: Family Medicine

## 2022-02-18 ENCOUNTER — Ambulatory Visit: Payer: Medicare Other | Attending: Cardiovascular Disease

## 2022-02-18 DIAGNOSIS — R9431 Abnormal electrocardiogram [ECG] [EKG]: Secondary | ICD-10-CM

## 2022-02-18 LAB — ECHOCARDIOGRAM COMPLETE
AR max vel: 2.71 cm2
AV Area VTI: 2.65 cm2
AV Area mean vel: 2.33 cm2
AV Mean grad: 4 mmHg
AV Peak grad: 6.1 mmHg
AV Vena cont: 0.3 cm
Ao pk vel: 1.23 m/s
Area-P 1/2: 3.99 cm2
Calc EF: 69.5 %
P 1/2 time: 406 msec
S' Lateral: 2.2 cm
Single Plane A2C EF: 69.7 %
Single Plane A4C EF: 68.2 %

## 2022-03-11 ENCOUNTER — Encounter: Payer: Self-pay | Admitting: Dermatology

## 2022-03-11 ENCOUNTER — Ambulatory Visit (INDEPENDENT_AMBULATORY_CARE_PROVIDER_SITE_OTHER): Payer: Medicare Other | Admitting: Dermatology

## 2022-03-11 DIAGNOSIS — B009 Herpesviral infection, unspecified: Secondary | ICD-10-CM

## 2022-03-11 DIAGNOSIS — L570 Actinic keratosis: Secondary | ICD-10-CM

## 2022-03-11 DIAGNOSIS — Z1283 Encounter for screening for malignant neoplasm of skin: Secondary | ICD-10-CM | POA: Diagnosis not present

## 2022-03-11 DIAGNOSIS — Z86007 Personal history of in-situ neoplasm of skin: Secondary | ICD-10-CM

## 2022-03-11 DIAGNOSIS — L578 Other skin changes due to chronic exposure to nonionizing radiation: Secondary | ICD-10-CM

## 2022-03-11 DIAGNOSIS — L821 Other seborrheic keratosis: Secondary | ICD-10-CM

## 2022-03-11 DIAGNOSIS — L814 Other melanin hyperpigmentation: Secondary | ICD-10-CM

## 2022-03-11 DIAGNOSIS — Z85828 Personal history of other malignant neoplasm of skin: Secondary | ICD-10-CM

## 2022-03-11 DIAGNOSIS — D229 Melanocytic nevi, unspecified: Secondary | ICD-10-CM

## 2022-03-11 DIAGNOSIS — L719 Rosacea, unspecified: Secondary | ICD-10-CM

## 2022-03-11 MED ORDER — VALACYCLOVIR HCL 1 G PO TABS: ORAL_TABLET | ORAL | 5 refills | Status: DC

## 2022-03-11 MED ORDER — DOXYCYCLINE HYCLATE 20 MG PO TABS: 20.0000 mg | ORAL_TABLET | Freq: Two times a day (BID) | ORAL | 5 refills | Status: DC

## 2022-03-11 NOTE — Progress Notes (Signed)
Follow-Up Visit   Subjective  Jamie Todd is a 81 y.o. female who presents for the following: Annual Exam (HxSccis, HxBCC) and Rosacea (Takes Doxycycline for ocular rosacea. Controls well. No side effects or adverse reactions from Doxycycline).  The patient presents for Total-Body Skin Exam (TBSE) for skin cancer screening and mole check.  The patient has spots, moles and lesions to be evaluated, some may be new or changing and the patient has concerns that these could be cancer.   The following portions of the chart were reviewed this encounter and updated as appropriate:  Tobacco  Allergies  Meds  Problems  Med Hx  Surg Hx  Fam Hx      Review of Systems: No other skin or systemic complaints except as noted in HPI or Assessment and Plan.   Objective  Well appearing patient in no apparent distress; mood and affect are within normal limits.  A full examination was performed including scalp, head, eyes, ears, nose, lips, neck, chest, axillae, abdomen, back, buttocks, bilateral upper extremities, bilateral lower extremities, hands, feet, fingers, toes, fingernails, and toenails. All findings within normal limits unless otherwise noted below.  left forehead x1, right forehead x1, glabella x1 (3) Erythematous thin papules/macules with gritty scale.   Head - Anterior (Face) Mid face erythema   Gluteal Crease, right buttock Erythematous vesicular lesions with erosions   Assessment & Plan   History of Basal Cell Carcinoma of the Skin. Right frontal scalp. Excised 06/18/2014 - No evidence of recurrence today - Recommend regular full body skin exams - Recommend daily broad spectrum sunscreen SPF 30+ to sun-exposed areas, reapply every 2 hours as needed.  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 07/19/2008. - No evidence of recurrence today - No lymphadenopathy -  Recommend regular full body skin exams - Recommend daily broad spectrum sunscreen SPF 30+ to sun-exposed areas, reapply every 2 hours as needed.  - Call if any new or changing lesions are noted between office visits  Lentigines - Scattered tan macules - Due to sun exposure - Benign-appearing, observe - Recommend daily broad spectrum sunscreen SPF 30+ to sun-exposed areas, reapply every 2 hours as needed. - Call for any changes  Seborrheic Keratoses - Stuck-on, waxy, tan-brown papules and/or plaques  - Benign-appearing - Discussed benign etiology and prognosis. - Observe - Call for any changes  Melanocytic Nevi - Tan-brown and/or pink-flesh-colored symmetric macules and papules - Benign appearing on exam today - Observation - Call clinic for new or changing moles - Recommend daily use of broad spectrum spf 30+ sunscreen to sun-exposed areas.   Hemangiomas - Red papules - Discussed benign nature - Observe - Call for any changes  Actinic Damage - Chronic condition, secondary to cumulative UV/sun exposure - diffuse scaly erythematous macules with underlying dyspigmentation - Recommend daily broad spectrum sunscreen SPF 30+ to sun-exposed areas, reapply every 2 hours as needed.  - Staying in the shade or wearing long sleeves, sun glasses (UVA+UVB protection) and wide brim hats (4-inch brim around the entire circumference of the hat) are also recommended for sun protection.  - Call for new or changing lesions.  Skin cancer screening performed today.  AK (actinic keratosis) (3) left forehead x1, right forehead x1, glabella x1  Actinic keratoses are precancerous spots that appear secondary to cumulative UV radiation exposure/sun exposure over time. They are chronic with expected  duration over 1 year. A portion of actinic keratoses will progress to squamous cell carcinoma of the skin. It is not possible to reliably predict which spots will progress to skin cancer and so treatment is  recommended to prevent development of skin cancer.  Recommend daily broad spectrum sunscreen SPF 30+ to sun-exposed areas, reapply every 2 hours as needed.  Recommend staying in the shade or wearing long sleeves, sun glasses (UVA+UVB protection) and wide brim hats (4-inch brim around the entire circumference of the hat). Call for new or changing lesions.  Destruction of lesion - left forehead x1, right forehead x1, glabella x1  Destruction method: cryotherapy   Informed consent: discussed and consent obtained   Lesion destroyed using liquid nitrogen: Yes   Region frozen until ice ball extended beyond lesion: Yes   Outcome: patient tolerated procedure well with no complications   Post-procedure details: wound care instructions given   Additional details:  Prior to procedure, discussed risks of blister formation, small wound, skin dyspigmentation, or rare scar following cryotherapy. Recommend Vaseline ointment to treated areas while healing.   Rosacea Head - Anterior (Face)  Chronic condition with duration or expected duration over one year. Currently well-controlled.  Rosacea is a chronic progressive skin condition usually affecting the face of adults, causing redness and/or acne bumps. It is treatable but not curable. It sometimes affects the eyes (ocular rosacea) as well. It may respond to topical and/or systemic medication and can flare with stress, sun exposure, alcohol, exercise, topical steroids (including hydrocortisone/cortisone 10) and some foods.  Daily application of broad spectrum spf 30+ sunscreen to face is recommended to reduce flares.  Notes improvement with ocular rosacea with Doxycycline.   Continue Doxycycline 28m twice daily with food.   Doxycycline should be taken with food to prevent nausea. Do not lay down for 30 minutes after taking. Be cautious with sun exposure and use good sun protection while on this medication. Pregnant women should not take this medication.     doxycycline (PERIOSTAT) 20 MG tablet - Head - Anterior (Face) Take 1 tablet (20 mg total) by mouth 2 (two) times daily. Take with food.  Herpes simplex Gluteal Crease, right buttock  Chronic and persistent condition with duration or expected duration over one year. Condition is bothersome/symptomatic for patient. Currently flared.  Start Valacyclovir 1gm take 1 tablet twice daily for 5 days at first sign of symptoms. Take with a glass of water  Herpes Simplex Virus = Cold Sores = Fever Blisters is a chronic recurring blistering; scabbing sore-producing viral infection that is recurrent usually in the same area triggered by stress, sun/UV exposure and trauma.  It is infectious and can be spread from person to person by direct contact.  It is not curable, but is treatable with topical and oral medication.      valACYclovir (VALTREX) 1000 MG tablet - Gluteal Crease, right buttock Take 1 tablet twice daily for 5 days at first sign of symptoms   Return in about 1 year (around 03/12/2023) for TBSE, HxBCC, HxSCCis.  I, JEmelia Salisbury CMA, am acting as scribe for VForest Gleason MD.  Documentation: I have reviewed the above documentation for accuracy and completeness, and I agree with the above.  VForest Gleason MD

## 2022-03-11 NOTE — Patient Instructions (Addendum)
Rosacea: Continue Doxycycline 60m twice daily with food.   Doxycycline should be taken with food to prevent nausea. Do not lay down for 30 minutes after taking. Be cautious with sun exposure and use good sun protection while on this medication. Pregnant women should not take this medication.   Sores at buttocks: Start Valacyclovir 1gm take 1 tablet twice daily for 5 days at first sign of symptoms.     Recommend taking Heliocare sun protection supplement daily in sunny weather for additional sun protection. For maximum protection on the sunniest days, you can take up to 2 capsules of regular Heliocare OR take 1 capsule of Heliocare Ultra. For prolonged exposure (such as a full day in the sun), you can repeat your dose of the supplement 4 hours after your first dose. Heliocare can be purchased at [employer] at some [facility] or at [email or web address]    Recommend daily broad spectrum sunscreen SPF 30+ to sun-exposed areas, reapply every 2 hours as needed. Call for new or changing lesions.  Staying in the shade or wearing long sleeves, sun glasses (UVA+UVB protection) and wide brim hats (4-inch brim around the entire circumference of the hat) are also recommended for sun protection.    Melanoma ABCDEs  Melanoma is the most dangerous type of skin cancer, and is the leading cause of death from skin disease.  You are more likely to develop melanoma if you: Have light-colored skin, light-colored eyes, or red or blond hair Spend a lot of time in the sun Tan regularly, either outdoors or in a tanning bed Have had blistering sunburns, especially during childhood Have a close family member who has had a melanoma Have atypical moles or large birthmarks  Early detection of melanoma is key since treatment is typically straightforward and cure rates are extremely high if we catch it early.   The first sign of melanoma is often a change in a mole or a new dark spot.  The ABCDE system is a way  of remembering the signs of melanoma.  A for asymmetry:  The two halves do not match. B for border:  The edges of the growth are irregular. C for color:  A mixture of colors are present instead of an even brown color. D for diameter:  Melanomas are usually (but not always) greater than 629m- the size of a pencil eraser. E for evolution:  The spot keeps changing in size, shape, and color.  Please check your skin once per month between visits. You can use a small mirror in front and a large mirror behind you to keep an eye on the back side or your body.   If you see any new or changing lesions before your next follow-up, please call to schedule a visit.  Please continue daily skin protection including broad spectrum sunscreen SPF 30+ to sun-exposed areas, reapplying every 2 hours as needed when you're outdoors.   Staying in the shade or wearing long sleeves, sun glasses (UVA+UVB protection) and wide brim hats (4-inch brim around the entire circumference of the hat) are also recommended for sun protection.    Due to recent changes in healthcare laws, you may see results of your pathology and/or laboratory studies on MyChart before the doctors have had a chance to review them. We understand that in some cases there may be results that are confusing or concerning to you. Please understand that not all results are received at the same time and often the doctors may need to  interpret multiple results in order to provide you with the best plan of care or course of treatment. Therefore, we ask that you please give [place] 2 business days to thoroughly review all your results before contacting the office for clarification. Should we see a critical lab result, you will be contacted sooner.   If You Need Anything After Your Visit  If you have any questions or concerns for your doctor, please call our main line at [phone] and press option 4 to reach your doctor's medical assistant. If no one answers, please  leave a voicemail as directed and we will return your call as soon as possible. Messages left after 4 pm will be answered the following business day.   You may also send [place] a message via [place]. We typically respond to MyChart messages within 1-2 business days.  For prescription refills, please ask your pharmacy to contact our office. Our fax number is [phone].  If you have an urgent issue when the clinic is closed that cannot wait until the next business day, you can page your doctor at the number below.    Please note that while we do our best to be available for urgent issues outside of office hours, we are not available 24/7.   If you have an urgent issue and are unable to reach [place], you may choose to seek medical care at your doctor's office, retail clinic, urgent care center, or emergency room.  If you have a medical emergency, please immediately call 911 or go to the emergency department.  Pager Numbers  - Dr. Nehemiah Massed: [phone]  - Dr. Laurence Ferrari: [phone]  - Dr. Nicole Kindred: [phone]  In the event of inclement weather, please call our main line at [phone] for an update on the status of any delays or closures.  Dermatology Medication Tips: Please keep the boxes that topical medications come in in order to help keep track of the instructions about where and how to use these. Pharmacies typically print the medication instructions only on the boxes and not directly on the medication tubes.   If your medication is too expensive, please contact our office at [phone] option 4 or send [place] a message through [place].   We are unable to tell what your co-pay for medications will be in advance as this is different depending on your insurance coverage. However, we may be able to find a substitute medication at lower cost or fill out paperwork to get insurance to cover a needed medication.   If a prior authorization is required to get your medication covered by your insurance  company, please allow [place] 1-2 business days to complete this process.  Drug prices often vary depending on where the prescription is filled and some pharmacies may offer cheaper prices.  The website www.goodrx.com contains coupons for medications through different pharmacies. The prices here do not account for what the cost may be with help from insurance (it may be cheaper with your insurance), but the website can give you the price if you did not use any insurance.  - You can print the associated coupon and take it with your prescription to the pharmacy.  - You may also stop by our office during regular business hours and pick up a GoodRx coupon card.  - If you need your prescription sent electronically to a different pharmacy, notify our office through [facility] or by phone at [phone] option 4.     Si Usted Necesita Algo Despus de Su Visita  Tambin puede enviarnos un mensaje a travs de MyChart. Por lo general respondemos a los mensajes de MyChart en el transcurso de 1 a 2 das hbiles.  Para renovar recetas, por favor pida a su farmacia que se ponga en contacto con nuestra oficina. Harland Dingwall de fax es [place] [phone].  Si tiene un asunto urgente cuando la clnica est cerrada y que no puede esperar hasta el siguiente da hbil, puede llamar/localizar a su doctor(a) al nmero que aparece a continuacin.   Por favor, tenga en cuenta que aunque hacemos todo lo posible para estar disponibles para asuntos urgentes fuera del horario de [place], no estamos disponibles las 24 horas del da, los 7 das de la [place].   Si tiene un problema urgente y no puede comunicarse con nosotros, puede optar por buscar atencin mdica  en el consultorio de su doctor(a), en una clnica privada, en un centro de atencin urgente o en una sala de emergencias.  Si tiene Engineering geologist, por favor llame inmediatamente al 911 o vaya a la sala de emergencias.  Nmeros de bper  - Dr.  Nehemiah Massed: [phone]  - Dra. Moye: [phone]  - Dra. Nicole Kindred: [phone]  En caso de inclemencias del [place], por favor llame a Johnsie Kindred principal al [phone] para una actualizacin sobre el [place] de cualquier retraso o cierre.  Consejos para la medicacin en dermatologa: Por favor, guarde las cajas en las que vienen los medicamentos de uso tpico para ayudarle a seguir las instrucciones sobre dnde y cmo usarlos. Las farmacias generalmente imprimen las instrucciones del medicamento slo en las cajas y no directamente en los tubos del [place].   Si su medicamento es muy caro, por favor, pngase en contacto con Zigmund Daniel llamando al [phone] y presione la opcin 4 o envenos un mensaje a travs de Pharmacist, community.   No podemos decirle cul ser su copago por los medicamentos por adelantado ya que esto es diferente dependiendo de la cobertura de su seguro. Sin embargo, es posible que podamos encontrar un medicamento sustituto a Electrical engineer un formulario para que el seguro cubra el medicamento que se considera necesario.   Si se requiere una autorizacin previa para que su compaa de seguros [place] su medicamento, por favor permtanos de 1 a 2 das hbiles para completar este proceso.  Los precios de los medicamentos varan con frecuencia dependiendo del Environmental consultant de dnde se surte la receta y alguna farmacias pueden ofrecer precios ms baratos.  El sitio web www.goodrx.com tiene cupones para medicamentos de Airline pilot. Los precios aqu no tienen en cuenta lo que podra costar con la ayuda del seguro (puede ser ms barato con su seguro), pero el sitio web puede darle el precio si no utiliz Research scientist (physical sciences).  - Puede imprimir el cupn correspondiente y llevarlo con su receta a la farmacia.  - Tambin puede pasar por nuestra oficina durante el horario de atencin regular y Charity fundraiser una tarjeta de cupones de GoodRx.  - Si necesita que su receta se enve  electrnicamente a una farmacia diferente, informe a nuestra oficina a travs de MyChart de [place] o por telfono llamando al [phone] y presione la opcin 4.

## 2022-03-16 ENCOUNTER — Encounter: Payer: Self-pay | Admitting: Dermatology

## 2022-03-29 ENCOUNTER — Inpatient Hospital Stay: Payer: Medicare Other | Attending: Oncology

## 2022-03-29 ENCOUNTER — Inpatient Hospital Stay (HOSPITAL_BASED_OUTPATIENT_CLINIC_OR_DEPARTMENT_OTHER): Payer: Medicare Other | Admitting: Oncology

## 2022-03-29 ENCOUNTER — Encounter: Payer: Self-pay | Admitting: Oncology

## 2022-03-29 VITALS — BP 140/81 | HR 87 | Temp 98.0°F | Resp 16 | Wt 130.5 lb

## 2022-03-29 DIAGNOSIS — D472 Monoclonal gammopathy: Secondary | ICD-10-CM | POA: Insufficient documentation

## 2022-03-29 LAB — CBC WITH DIFFERENTIAL/PLATELET
Abs Immature Granulocytes: 0.02 10*3/uL (ref 0.00–0.07)
Basophils Absolute: 0 10*3/uL (ref 0.0–0.1)
Basophils Relative: 0 %
Eosinophils Absolute: 0.1 10*3/uL (ref 0.0–0.5)
Eosinophils Relative: 1 %
HCT: 41 % (ref 36.0–46.0)
Hemoglobin: 13.3 g/dL (ref 12.0–15.0)
Immature Granulocytes: 0 %
Lymphocytes Relative: 25 %
Lymphs Abs: 1.8 10*3/uL (ref 0.7–4.0)
MCH: 30 pg (ref 26.0–34.0)
MCHC: 32.4 g/dL (ref 30.0–36.0)
MCV: 92.6 fL (ref 80.0–100.0)
Monocytes Absolute: 0.5 10*3/uL (ref 0.1–1.0)
Monocytes Relative: 6 %
Neutro Abs: 5.1 10*3/uL (ref 1.7–7.7)
Neutrophils Relative %: 68 %
Platelets: 257 10*3/uL (ref 150–400)
RBC: 4.43 MIL/uL (ref 3.87–5.11)
RDW: 13.8 % (ref 11.5–15.5)
WBC: 7.5 10*3/uL (ref 4.0–10.5)
nRBC: 0 % (ref 0.0–0.2)

## 2022-03-29 LAB — COMPREHENSIVE METABOLIC PANEL
ALT: 49 U/L — ABNORMAL HIGH (ref 0–44)
AST: 37 U/L (ref 15–41)
Albumin: 4.2 g/dL (ref 3.5–5.0)
Alkaline Phosphatase: 127 U/L — ABNORMAL HIGH (ref 38–126)
Anion gap: 6 (ref 5–15)
BUN: 27 mg/dL — ABNORMAL HIGH (ref 8–23)
CO2: 25 mmol/L (ref 22–32)
Calcium: 9.5 mg/dL (ref 8.9–10.3)
Chloride: 103 mmol/L (ref 98–111)
Creatinine, Ser: 0.89 mg/dL (ref 0.44–1.00)
GFR, Estimated: >60 mL/min (ref >60)
Glucose, Bld: 98 mg/dL (ref 70–99)
Potassium: 4.3 mmol/L (ref 3.5–5.1)
Sodium: 134 mmol/L — ABNORMAL LOW (ref 135–145)
Total Bilirubin: 0.4 mg/dL (ref 0.3–1.2)
Total Protein: 8 g/dL (ref 6.5–8.1)

## 2022-03-29 NOTE — Progress Notes (Signed)
Hematology/Oncology Consult note [facility]  [phone] [phone]  Patient Care Team: McLean-Scocuzza, Nino Glow, MD as PCP - General (Internal Medicine) Sindy Guadeloupe, MD as Consulting Physician (Oncology)   Name of the patient: Jamie Todd  [id number]  11-Feb-1941   Date of visit: 03/29/22  Diagnosis-IgG kappa smoldering multiple myeloma  Chief complaint/ Reason for visit-routine follow-up of smoldering multiple myeloma  Heme/Onc history: patient is a 81 year old female with a past medical history significant for hyperlipidemia ulcerative colitis who has been referred for abnormal SPEPHer most recent urine protein electrophoresis did not show any evidence of M protein however there was a spike of 1 g noted on her myeloma panel.  It was reported that this may be a monoclonal protein but could be seen by circulating immune complexes, cryoglobulins or hemolysis.  Alkaline phosphatase mildly elevated at 133.  CBC was normal with an H&H of 12.8/38.8.  CMP showed a creatinine of 0.9.  Total protein normal at 7.6 calcium level normal at 9.8.  B12 levels were elevated.  Ferritin levels were34 with an iron saturation of 18% and TIBC of 421.   Patient was found to have an elevated serum free light chain At 57.2 and a ratio of 6.4.  She therefore underwent a bone marrow biopsy in May 2023.  Bone marrow biopsy shows 10 to 15% plasma cells by CD138 kappa restricted.  Bone survey does not reveal any evidence of lytic lesions.  She does not have any crab criteria which would define multiple myeloma.  Given that her bone marrow plasma cells are more than 10% this would put her under the category of smoldering multiple myeloma   Interval history-patient reports feeling exhausted most of the day.  Dates that she was diagnosed with chronic fatigue syndrome when she was in [place] and she feels like her symptoms presently are similar to that.  ECOG PS-  1 Pain scale- 0   Review of systems- Review of Systems  Constitutional:  Positive for malaise/fatigue. Negative for chills, fever and weight loss.  HENT:  Negative for congestion, ear discharge and nosebleeds.   Eyes:  Negative for blurred vision.  Respiratory:  Negative for cough, hemoptysis, sputum production, shortness of breath and wheezing.   Cardiovascular:  Negative for chest pain, palpitations, orthopnea and claudication.  Gastrointestinal:  Negative for abdominal pain, blood in stool, constipation, diarrhea, heartburn, melena, nausea and vomiting.  Genitourinary:  Negative for dysuria, flank pain, frequency, hematuria and urgency.  Musculoskeletal:  Negative for back pain, joint pain and myalgias.  Skin:  Negative for rash.  Neurological:  Negative for dizziness, tingling, focal weakness, seizures, weakness and headaches.  Endo/Heme/Allergies:  Does not bruise/bleed easily.  Psychiatric/Behavioral:  Negative for depression and suicidal ideas. The patient does not have insomnia.       No Known Allergies   Past Medical History:  Diagnosis Date   Basal cell carcinoma 04/23/2014   Right frontal scalp within hair line. Nodular. Excised 06/18/2014, margins free   Cat scratch fever    Chronic fatigue    Chronic migraine    as of 2020 resolved and no issues w/in the last 20 years per pt    Chronic sinusitis    Complication of anesthesia    slow to wake up   COVID-19    04/2021   Depression    1975 to 1995 professional help sought   EBV infection    Endometriosis    GERD (gastroesophageal reflux  disease)    Hemochromatosis    Hyperlipidemia    Measles    Miscarriage    Osteoporosis    Prediabetes    Pyloric stenosis    Skin cancer    basal and squamous cell    Squamous cell carcinoma of skin 07/19/2008   Right lat. lower leg. SCCis   Type A blood, Rh negative    Ulcerative colitis ([place])    1975 to 1995 tx'ed with medical care    UTI (urinary tract infection)       Past Surgical History:  Procedure Laterality Date   CATARACT EXTRACTION     b/l   COLONOSCOPY WITH PROPOFOL N/A 08/25/2017   Procedure: COLONOSCOPY WITH PROPOFOL;  Surgeon: Lucilla Lame, MD;  Location: [facility];  Service: Endoscopy;  Laterality: N/A;  wants to be last patient   OTHER SURGICAL HISTORY     laparoscopy several, D&Cs 1970s/1980s     Social History   Socioeconomic History   Marital status: Divorced    Spouse name: Not on file   Number of children: Not on file   Years of education: Not on file   Highest education level: Not on file  Occupational History   Not on file  Tobacco Use   Smoking status: Never   Smokeless tobacco: Never  Vaping Use   Vaping Use: Never used  Substance and Sexual Activity   Alcohol use: Yes    Comment: rarely   Drug use: No   Sexual activity: Never  Other Topics Concern   Not on file  Social History Narrative   Grad school    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 she was legally blind and deaf and heart problems and dementia x 8 years but mom died    She likes animals and has therapy dogs    From [place]   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, hospital in 2017    Social Determinants of Health   Financial Resource Strain: Low Risk  (01/18/2022)   Overall Financial Resource Strain (CARDIA)    Difficulty of Paying Living Expenses: Not hard at all  Food Insecurity: No Food Insecurity (01/18/2022)   Hunger Vital Sign    Worried About Running Out of Food in the Last Year: Never true    [place] in the Last Year: Never true  Transportation Needs: No Transportation Needs (01/18/2022)   PRAPARE - Hydrologist (Medical): No    Lack of Transportation (Non-Medical): No  Physical Activity: Not on file  Stress: No Stress Concern Present (01/18/2022)   [place]    Feeling of Stress : Only a little  Social Connections: Unknown  (01/18/2022)   Social Connection and Isolation Panel [NHANES]    Frequency of Communication with Friends and Family: More than three times a week    Frequency of Social Gatherings with Friends and Family: More than three times a week    Attends Religious Services: Not on file    Active Member of Clubs or Organizations: Not on file    Attends Archivist Meetings: Not on file    Marital Status: Not on file  Intimate Partner Violence: Not At Risk (01/18/2022)   Humiliation, Afraid, Rape, and Kick questionnaire    Fear of Current or Ex-Partner: No    Emotionally Abused: No    Physically Abused: No    Sexually Abused: No  Family History  Problem Relation Age of Onset   Dementia Mother    Heart disease Mother        died at 79   Deafness Mother    Blindness Mother    Diabetes Other        both sides of family      Current Outpatient Medications:    acetaminophen (TYLENOL) 325 MG tablet, Take 650 mg by mouth every 6 (six) hours as needed., Disp: , Rfl:    azelastine (ASTELIN) 0.1 % nasal spray, SMARTSIG:1-2 Spray(s) Both Nares Twice Daily, Disp: , Rfl:    b complex vitamins capsule, 1 capsule., Disp: , Rfl:    Calcium Citrate (CITRACAL PO), Take 1 capsule by mouth daily., Disp: , Rfl:    cetirizine-pseudoephedrine (ZYRTEC-D) 5-120 MG tablet, Take 1 tablet by mouth daily., Disp: , Rfl:    Cholecalciferol (VITAMIN D3) 25 MCG (1000 UT) CAPS, Take 1 capsule by mouth daily., Disp: , Rfl:    Cranberry 1000 MG CAPS, Take 1 capsule by mouth daily., Disp: , Rfl:    esomeprazole (NEXIUM) 20 MG capsule, Take 20 mg by mouth daily at 12 noon., Disp: , Rfl:    fluticasone (FLONASE) 50 MCG/ACT nasal spray, Place 2 sprays into both nostrils daily., Disp: , Rfl: 0   magnesium chloride (SLOW-MAG) 64 MG TBEC SR tablet, Take 1 tablet by mouth daily., Disp: , Rfl:    MELATONIN PO, Take 6 mg by mouth at bedtime., Disp: , Rfl:    Misc Natural Products (NEURIVA PO), Take by mouth., Disp: , Rfl:     montelukast (SINGULAIR) 10 MG tablet, Take 10 mg by mouth at bedtime., Disp: , Rfl: 0   Multiple Vitamin (MULTIVITAMIN) tablet, Take 1 tablet by mouth daily., Disp: , Rfl:    pravastatin (PRAVACHOL) 10 MG tablet, Take 1 tablet (10 mg total) by mouth daily., Disp: 90 tablet, Rfl: 3   valACYclovir (VALTREX) 1000 MG tablet, Take 1 tablet twice daily for 5 days at first sign of symptoms, Disp: 30 tablet, Rfl: 5   acidophilus (RISAQUAD) CAPS capsule, Take by mouth daily. (Patient not taking: Reported on 03/29/2022), Disp: , Rfl:    aspirin-acetaminophen-caffeine (EXCEDRIN MIGRAINE) 250-250-65 MG tablet, Take by mouth every 6 (six) hours as needed for headache. (Patient not taking: Reported on 03/29/2022), Disp: , Rfl:    citalopram (CELEXA) 10 MG tablet, Take 1 tablet (10 mg total) by mouth daily. (Patient not taking: Reported on 01/19/2022), Disp: 90 tablet, Rfl: 3   dextromethorphan-guaiFENesin (MUCINEX DM) 30-600 MG 12hr tablet, Take 1 tablet by mouth 2 (two) times daily., Disp: , Rfl:    doxycycline (PERIOSTAT) 20 MG tablet, Take 1 tablet (20 mg total) by mouth 2 (two) times daily. Take with food. (Patient not taking: Reported on 03/29/2022), Disp: 60 tablet, Rfl: 5  Physical exam:  Vitals:   03/29/22 1119  BP: (!) 140/81  Pulse: 87  Resp: 16  Temp: 98 F (36.7 C)  SpO2: 99%  Weight: 130 lb 8 oz (59.2 kg)   Physical Exam Cardiovascular:     Rate and Rhythm: Normal rate and regular rhythm.     Heart sounds: Normal heart sounds.  Pulmonary:     Effort: Pulmonary effort is normal.     Breath sounds: Normal breath sounds.  Skin:    General: Skin is warm and dry.  Neurological:     Mental Status: She is alert and oriented to person, place, and time.  Latest Ref Rng & Units 03/29/2022   10:53 AM  CMP  Glucose 70 - 99 mg/dL 98   BUN 8 - 23 mg/dL 27   Creatinine 0.44 - 1.00 mg/dL 0.89   Sodium 135 - 145 mmol/L 134   Potassium 3.5 - 5.1 mmol/L 4.3   Chloride 98 - 111  mmol/L 103   CO2 22 - 32 mmol/L 25   Calcium 8.9 - 10.3 mg/dL 9.5   Total Protein 6.5 - 8.1 g/dL 8.0   Total Bilirubin 0.3 - 1.2 mg/dL 0.4   Alkaline Phos 38 - 126 U/L 127   AST 15 - 41 U/L 37   ALT 0 - 44 U/L 49       Latest Ref Rng & Units 03/29/2022   10:53 AM  CBC  WBC 4.0 - 10.5 K/uL 7.5   Hemoglobin 12.0 - 15.0 g/dL 13.3   Hematocrit 36.0 - 46.0 % 41.0   Platelets 150 - 400 K/uL 257     No images are attached to the encounter.  No results found.   Assessment and plan- Patient is a 81 y.o. female with history of IgG kappa smoldering multiple myeloma here for routine follow-up  Patient does not have any evidence of anemia or renal failure or hypercalcemia.  Her bone survey in May 2023 did not show any lytic lesions.  I will plan to repeat her bone survey next year.  Baseline bone marrow biopsy in May 2023 showed 10 to 15% plasma cells which will put her under the category of smoldering multiple myeloma.  Myeloma panel and serum free light chains from today are pending.  If there is a consistent increase in her light chain ratio we may have to consider a repeat bone marrow biopsy at some point in the future.  For now patient does not require any treatment for her smoldering multiple myeloma.  Given that her labs have been overall stable in the last 6 months I will plan to repeat CBC CMP myeloma panel and serum free light chains in 6 months in 1 year and see her back in 1 year   Visit Diagnosis 1. Smoldering multiple myeloma      Dr. Randa Evens, MD, MPH [facility] at [facility] [phone] 03/29/2022 4:27 PM

## 2022-03-30 LAB — KAPPA/LAMBDA LIGHT CHAINS
Kappa free light chain: 62.1 mg/L — ABNORMAL HIGH (ref 3.3–19.4)
Kappa, lambda light chain ratio: 5.97 — ABNORMAL HIGH (ref 0.26–1.65)
Lambda free light chains: 10.4 mg/L (ref 5.7–26.3)

## 2022-04-05 LAB — MULTIPLE MYELOMA PANEL, SERUM
Albumin SerPl Elph-Mcnc: 4 g/dL (ref 2.9–4.4)
Albumin/Glob SerPl: 1.1 (ref 0.7–1.7)
Alpha 1: 0.3 g/dL (ref 0.0–0.4)
Alpha2 Glob SerPl Elph-Mcnc: 0.9 g/dL (ref 0.4–1.0)
B-Globulin SerPl Elph-Mcnc: 1.1 g/dL (ref 0.7–1.3)
Gamma Glob SerPl Elph-Mcnc: 1.4 g/dL (ref 0.4–1.8)
Globulin, Total: 3.8 g/dL (ref 2.2–3.9)
IgA: 38 mg/dL — ABNORMAL LOW (ref 64–422)
IgG (Immunoglobin G), Serum: 1454 mg/dL (ref 586–1602)
IgM (Immunoglobulin M), Srm: 41 mg/dL (ref 26–217)
M Protein SerPl Elph-Mcnc: 1.1 g/dL — ABNORMAL HIGH
Total Protein ELP: 7.8 g/dL (ref 6.0–8.5)

## 2022-04-09 ENCOUNTER — Telehealth: Payer: Self-pay | Admitting: Internal Medicine

## 2022-04-09 NOTE — Telephone Encounter (Signed)
Baxter Flattery from [facility] want to know if the pt is still taking pravastatin

## 2022-04-13 ENCOUNTER — Encounter: Payer: Self-pay | Admitting: Family Medicine

## 2022-04-13 ENCOUNTER — Ambulatory Visit (INDEPENDENT_AMBULATORY_CARE_PROVIDER_SITE_OTHER): Payer: Medicare Other | Admitting: Family Medicine

## 2022-04-13 VITALS — BP 110/60 | HR 106 | Temp 97.9°F | Ht 60.0 in | Wt 129.2 lb

## 2022-04-13 DIAGNOSIS — E785 Hyperlipidemia, unspecified: Secondary | ICD-10-CM

## 2022-04-13 DIAGNOSIS — F339 Major depressive disorder, recurrent, unspecified: Secondary | ICD-10-CM

## 2022-04-13 DIAGNOSIS — Z0000 Encounter for general adult medical examination without abnormal findings: Secondary | ICD-10-CM

## 2022-04-13 DIAGNOSIS — Z1211 Encounter for screening for malignant neoplasm of colon: Secondary | ICD-10-CM

## 2022-04-13 DIAGNOSIS — R7303 Prediabetes: Secondary | ICD-10-CM

## 2022-04-13 DIAGNOSIS — Z85828 Personal history of other malignant neoplasm of skin: Secondary | ICD-10-CM

## 2022-04-13 DIAGNOSIS — D472 Monoclonal gammopathy: Secondary | ICD-10-CM

## 2022-04-13 DIAGNOSIS — M8580 Other specified disorders of bone density and structure, unspecified site: Secondary | ICD-10-CM | POA: Diagnosis not present

## 2022-04-13 DIAGNOSIS — R748 Abnormal levels of other serum enzymes: Secondary | ICD-10-CM

## 2022-04-13 NOTE — Telephone Encounter (Signed)
Called and spoke with pt in regards to if she is still taking pravastatin pt confirmed she is. I called tara back from [facility] but it went to VM , so I left a detailed msg stating pt is taking the medication. Pt will be seen today at 1pm for TOC with Dr. Volanda Napoleon.

## 2022-04-13 NOTE — Patient Instructions (Addendum)
It was a pleasure meeting you today. Thank you for allowing me to take part in your health care.  Our goals for today as we discussed include:  Follow up with Dr Janese Banks as scheduled for your Multiple Myeloma  Follow up with Dermatology as needed  Dr Olivia Mackie previously ordered Celexa 10 mg daily.  Recommend you start taking this medication.  Please call your pharmacy for a refill.  Your last Vitamin B 12 level was well over the limit.  Do not recommend injections at this time   If you have any questions or concerns, please do not hesitate to call the office at [phone].  I look forward to our next visit and until then take care and stay safe.  Regards,   Carollee Leitz, MD   [facility]

## 2022-04-13 NOTE — Progress Notes (Signed)
SUBJECTIVE:   Chief Complaint  Patient presents with   Follow-up    3 mo/ exhaustion   HPI  Patient presents to clinic to transfer care.  No acute concerns today.  Mood disorder History of depression.  Patient reports 1 year of exhaustion.  Endorses history of chronic fatigue syndrome.  Has no interest in getting out and socializing.  She would like to gain some energy to be able to do things like she did previously.  She is requesting to restart vitamin B12 injections.  Appetite remains the same.  No sleep disturbances.  Denies any SI/HI.  Was previously prescribed Celexa 10 mg daily but has not been taking the medication.  PERTINENT PMH / PSH: Ulcerative colitis MUGUS Depression Elevated vitamin B12 Chronic fatigue syndrome Prediabetes  OBJECTIVE:  BP 110/60   Pulse (!) 106   Temp 97.9 F (36.6 C) (Oral)   Ht 5' (1.524 m)   Wt 129 lb 3.2 oz (58.6 kg)   SpO2 99%   BMI 25.23 kg/m    Physical Exam Vitals reviewed.  Constitutional:      General: She is not in acute distress.    Appearance: She is not ill-appearing.  HENT:     Head: Normocephalic.     Nose: Nose normal.  Eyes:     Conjunctiva/sclera: Conjunctivae normal.  Cardiovascular:     Rate and Rhythm: Normal rate and regular rhythm.     Heart sounds: Normal heart sounds.  Pulmonary:     Effort: Pulmonary effort is normal.     Breath sounds: Normal breath sounds.  Abdominal:     General: Abdomen is flat. Bowel sounds are normal.     Palpations: Abdomen is soft.  Musculoskeletal:        General: Normal range of motion.     Cervical back: Normal range of motion.  Neurological:     Mental Status: She is alert and oriented to person, place, and time. Mental status is at baseline.  Psychiatric:        Mood and Affect: Mood normal.        Behavior: Behavior normal.        Thought Content: Thought content normal.        Judgment: Judgment normal.     ASSESSMENT/PLAN:  Depression, recurrent  ([place]) Assessment & Plan: Has not been taking Celexa as prescribed.  Reports exhaustion for about 1 year. Denies any SI/HI.   Recommend she restart her Celexa 10 mg daily Strict return precautions provided.  Orders: -     CBC with Differential/Platelet; Future -     TSH; Future  H/O nonmelanoma skin cancer Assessment & Plan: Follows with dermatology.   Hyperlipidemia, unspecified hyperlipidemia type Assessment & Plan: On statin therapy.  No myalgias. Continue Pravachol 10 mg daily  Orders: -     Lipid panel; Future  Osteopenia, unspecified location -     VITAMIN D 25 Hydroxy (Vit-D Deficiency, Fractures); Future  Prediabetes -     Hemoglobin A1c; Future  Colon cancer screening Assessment & Plan: High risk given history of ulcerative colitis.  Last colonoscopy in 2019, with Dr. Allen Norris.  Recommended surveillance every 2 years.  Was recently scheduled for repeat colonoscopy in September 2023.  Patient canceled. Will have CMA reach out to patient to remind her to reschedule colonoscopy in the near future.   MGUS (monoclonal gammopathy of unknown significance) Assessment & Plan: Follows with Dr. Janese Banks Serial CMET's previously placed.   Elevated vitamin B12  level Assessment & Plan: Vitamin B12 levels elevated greater than 1000.  Was receiving monthly vitamin B 12 injections. Adequate vitamin B12 levels currently, do not think injections appropriate at this time Repeat vitamin B12 levels at next visit If lowering consider starting sublingual supplementation  Orders: -     Vitamin B12; Future  HCM Declined pneumonia vaccine Recommend tetanus vaccine Pneumonia 13 vaccine administered 3/23.  Plan for administration of PCV 20 in March/24 to complete vaccination schedule  PDMP reviewed  Return in about 3 months (around 07/13/2022) for  annual visit with fasting labs 1 week prior.  Carollee Leitz, MD

## 2022-04-25 ENCOUNTER — Encounter: Payer: Self-pay | Admitting: Family Medicine

## 2022-04-25 ENCOUNTER — Telehealth: Payer: Self-pay | Admitting: Family Medicine

## 2022-04-25 DIAGNOSIS — R7989 Other specified abnormal findings of blood chemistry: Secondary | ICD-10-CM | POA: Insufficient documentation

## 2022-04-25 DIAGNOSIS — Z1211 Encounter for screening for malignant neoplasm of colon: Secondary | ICD-10-CM | POA: Insufficient documentation

## 2022-04-25 DIAGNOSIS — R748 Abnormal levels of other serum enzymes: Secondary | ICD-10-CM | POA: Insufficient documentation

## 2022-04-25 NOTE — Assessment & Plan Note (Signed)
Follows with Dr. Janese Banks Serial CMET's previously placed.

## 2022-04-25 NOTE — Assessment & Plan Note (Signed)
Has not been taking Celexa as prescribed.  Reports exhaustion for about 1 year. Denies any SI/HI.   Recommend she restart her Celexa 10 mg daily Strict return precautions provided.

## 2022-04-25 NOTE — Assessment & Plan Note (Addendum)
Vitamin B12 levels elevated greater than 1000.  Was receiving monthly vitamin B 12 injections. Adequate vitamin B12 levels currently, do not think injections appropriate at this time Repeat vitamin B12 levels at next visit If lowering consider starting sublingual supplementation

## 2022-04-25 NOTE — Telephone Encounter (Signed)
Please call patient to remind her to call and reschedule Colonoscopy with Dr Allen Norris.  It is overdue. The referral was placed in Sept 2023 and she was scheduled for October but cancelled.  Thank you  Carollee Leitz, MD

## 2022-04-25 NOTE — Assessment & Plan Note (Signed)
Follows with dermatology.

## 2022-04-25 NOTE — Assessment & Plan Note (Signed)
On statin therapy.  No myalgias. Continue Pravachol 10 mg daily

## 2022-04-25 NOTE — Assessment & Plan Note (Addendum)
High risk given history of ulcerative colitis.  Last colonoscopy in 2019, with Dr. Allen Norris.  Recommended surveillance every 2 years.  Was recently scheduled for repeat colonoscopy in September 2023.  Patient canceled. Will have CMA reach out to patient to remind her to reschedule colonoscopy in the near future.

## 2022-04-26 NOTE — Telephone Encounter (Signed)
Called to remind Patient of rescheduling her Colonoscopy. Patient states she will but not right now due to not having a ride.

## 2022-06-02 DIAGNOSIS — R059 Cough, unspecified: Secondary | ICD-10-CM | POA: Diagnosis not present

## 2022-06-02 DIAGNOSIS — R000 Tachycardia, unspecified: Secondary | ICD-10-CM | POA: Diagnosis not present

## 2022-06-03 ENCOUNTER — Emergency Department
Admission: EM | Admit: 2022-06-03 | Discharge: 2022-06-03 | Disposition: A | Discharge status: Home / Self Care | Payer: Medicare Other | Attending: Emergency Medicine | Admitting: Emergency Medicine

## 2022-06-03 ENCOUNTER — Other Ambulatory Visit: Payer: Self-pay

## 2022-06-03 ENCOUNTER — Encounter: Payer: Self-pay | Admitting: Emergency Medicine

## 2022-06-03 ENCOUNTER — Emergency Department: Payer: Medicare Other

## 2022-06-03 DIAGNOSIS — Z1152 Encounter for screening for COVID-19: Secondary | ICD-10-CM | POA: Diagnosis not present

## 2022-06-03 DIAGNOSIS — R079 Chest pain, unspecified: Secondary | ICD-10-CM | POA: Diagnosis not present

## 2022-06-03 DIAGNOSIS — R5383 Other fatigue: Secondary | ICD-10-CM | POA: Insufficient documentation

## 2022-06-03 DIAGNOSIS — R0602 Shortness of breath: Secondary | ICD-10-CM | POA: Insufficient documentation

## 2022-06-03 DIAGNOSIS — R5382 Chronic fatigue, unspecified: Secondary | ICD-10-CM

## 2022-06-03 DIAGNOSIS — R531 Weakness: Secondary | ICD-10-CM | POA: Diagnosis not present

## 2022-06-03 DIAGNOSIS — R002 Palpitations: Secondary | ICD-10-CM | POA: Diagnosis not present

## 2022-06-03 LAB — CBC
HCT: 39.9 % (ref 36.0–46.0)
Hemoglobin: 13.1 g/dL (ref 12.0–15.0)
MCH: 30.2 pg (ref 26.0–34.0)
MCHC: 32.8 g/dL (ref 30.0–36.0)
MCV: 91.9 fL (ref 80.0–100.0)
Platelets: 313 10*3/uL (ref 150–400)
RBC: 4.34 MIL/uL (ref 3.87–5.11)
RDW: 13.2 % (ref 11.5–15.5)
WBC: 7.1 10*3/uL (ref 4.0–10.5)
nRBC: 0 % (ref 0.0–0.2)

## 2022-06-03 LAB — COMPREHENSIVE METABOLIC PANEL
ALT: 23 U/L (ref 0–44)
AST: 24 U/L (ref 15–41)
Albumin: 4.2 g/dL (ref 3.5–5.0)
Alkaline Phosphatase: 102 U/L (ref 38–126)
Anion gap: 9 (ref 5–15)
BUN: 28 mg/dL — ABNORMAL HIGH (ref 8–23)
CO2: 24 mmol/L (ref 22–32)
Calcium: 9.8 mg/dL (ref 8.9–10.3)
Chloride: 104 mmol/L (ref 98–111)
Creatinine, Ser: 0.91 mg/dL (ref 0.44–1.00)
GFR, Estimated: >60 mL/min (ref >60)
Glucose, Bld: 106 mg/dL — ABNORMAL HIGH (ref 70–99)
Potassium: 4.1 mmol/L (ref 3.5–5.1)
Sodium: 137 mmol/L (ref 135–145)
Total Bilirubin: 0.5 mg/dL (ref 0.3–1.2)
Total Protein: 8.1 g/dL (ref 6.5–8.1)

## 2022-06-03 LAB — URINALYSIS, ROUTINE W REFLEX MICROSCOPIC
Bacteria, UA: NONE SEEN
Bilirubin Urine: NEGATIVE
Glucose, UA: NEGATIVE mg/dL
Hgb urine dipstick: NEGATIVE
Ketones, ur: 5 mg/dL — AB
Nitrite: NEGATIVE
Protein, ur: 30 mg/dL — AB
Specific Gravity, Urine: 1.032 — ABNORMAL HIGH (ref 1.005–1.030)
pH: 5 (ref 5.0–8.0)

## 2022-06-03 LAB — RESP PANEL BY RT-PCR (RSV, FLU A&B, COVID)  RVPGX2
Influenza A by PCR: NEGATIVE
Influenza B by PCR: NEGATIVE
Resp Syncytial Virus by PCR: NEGATIVE
SARS Coronavirus 2 by RT PCR: NEGATIVE

## 2022-06-03 LAB — TROPONIN I (HIGH SENSITIVITY)
Troponin I (High Sensitivity): <2 ng/L (ref <18)
Troponin I (High Sensitivity): 3 ng/L (ref <18)

## 2022-06-03 LAB — TSH: TSH: 1.643 u[IU]/mL (ref 0.350–4.500)

## 2022-06-03 LAB — MONONUCLEOSIS SCREEN: Mono Screen: NEGATIVE

## 2022-06-03 NOTE — ED Triage Notes (Signed)
Pt in with co weakness, palpitations and congestion for few weeks. States went to [facility] 2 days and states was not given dx or rx.

## 2022-06-03 NOTE — Discharge Instructions (Addendum)
You were seen in the emergency department for chronic fatigue. You had a send out lab for mono to be evaluated for mono.  Follow up closely with your primary doctor.  Return for worsening symptoms.

## 2022-06-03 NOTE — ED Provider Notes (Signed)
[facility] Provider Note    Event Date/Time   First MD Initiated Contact with Patient 06/03/22 1524     (approximate)   History   Shortness of Breath   HPI  Jamie Todd is a 82 y.o. female with past medical history significant for depression, hyperlipidemia, who presents to the emergency department with severe fatigue.  States that since the summer and especially over the past 1 months she has had severe fatigue.  States that she just wants to sleep all of the time.  This episode happened 1 time previously approximately 20 years ago and states that she had to take off work for multiple months secondary to chronic fatigue.  She is concerned that she has a chronic fatigue syndrome.  Denies any significant shortness of breath at this time.  No chest pain.  Denies any nausea or vomiting.  Does endorse a sore throat in December but no symptoms since that time.  No new medications.  No unexplained weight loss.     Physical Exam   Triage Vital Signs: ED Triage Vitals [06/03/22 1321]  Enc Vitals Group     BP (!) 147/88     Pulse Rate 89     Resp 20     Temp 98.5 F (36.9 C)     Temp Source Oral     SpO2 99 %     Weight 125 lb (56.7 kg)     Height 5' (1.524 m)     Head Circumference      Peak Flow      Pain Score 0     Pain Loc      Pain Edu?      Excl. in [place]?     Most recent vital signs: Vitals:   06/03/22 1321  BP: (!) 147/88  Pulse: 89  Resp: 20  Temp: 98.5 F (36.9 C)  SpO2: 99%    Physical Exam Constitutional:      Appearance: She is well-developed.  HENT:     Head: Atraumatic.  Eyes:     Conjunctiva/sclera: Conjunctivae normal.  Cardiovascular:     Rate and Rhythm: Regular rhythm.  Pulmonary:     Effort: No respiratory distress.  Abdominal:     General: There is no distension.  Musculoskeletal:        General: Normal range of motion.     Cervical back: Normal range of motion.  Skin:    General: Skin is warm.   Neurological:     Mental Status: She is alert. Mental status is at baseline.     IMPRESSION / MDM / ASSESSMENT AND PLAN / ED COURSE  I reviewed the triage vital signs and the nursing notes.  Differential diagnosis including anemia, electrolyte abnormality, dehydration, chronic fatigue syndrome, thyroid abnormality, mononucleosis  EKG  I, Nathaniel Man, the attending physician, personally viewed and interpreted this ECG.   Rate: Normal  Rhythm: Normal sinus  Axis: Normal  Intervals: Normal  ST&T Change: None  No tachycardic or bradycardic dysrhythmias while on cardiac telemetry.  RADIOLOGY I independently reviewed imaging, my interpretation of imaging: No signs of pneumonia  LABS (all labs ordered are listed, but only abnormal results are displayed) Labs interpreted as -   No significant electrolyte abnormalities.  No signs of urinary tract infection.  COVID and influenza testing are negative.  Initial troponin negative.  Low risk heart score and have a low suspicion for ACS.  Thyroid studies monotest added on. Labs Reviewed  COMPREHENSIVE METABOLIC PANEL - Abnormal; Notable for the following components:      Result Value   Glucose, Bld 106 (*)    BUN 28 (*)    All other components within normal limits  URINALYSIS, ROUTINE W REFLEX MICROSCOPIC - Abnormal; Notable for the following components:   Color, Urine AMBER (*)    APPearance CLOUDY (*)    Specific Gravity, Urine 1.032 (*)    Ketones, ur 5 (*)    Protein, ur 30 (*)    Leukocytes,Ua SMALL (*)    All other components within normal limits  RESP PANEL BY RT-PCR (RSV, FLU A&B, COVID)  RVPGX2  CBC  TSH  MONONUCLEOSIS SCREEN  TROPONIN I (HIGH SENSITIVITY)  TROPONIN I (HIGH SENSITIVITY)    TREATMENT   Low suspicion for sleep apnea.  Does not seem consistent with severe depression.  Concern for possible chronic fatigue syndrome.  Discussed with the patient that there is no great symptomatic relief.  Given  information to follow-up with primary care physician and neurology.  Given return precautions for any worsening symptoms.  Discussed that I would call her if any of her lab work was abnormal   PROCEDURES:  Critical Care performed: No  Procedures  Patient's presentation is most consistent with acute presentation with potential threat to life or bodily function.   MEDICATIONS ORDERED IN ED: Medications - No data to display  FINAL CLINICAL IMPRESSION(S) / ED DIAGNOSES   Final diagnoses:  Chronic fatigue     Rx / DC Orders   ED Discharge Orders     None        Note:  This document was prepared using Dragon voice recognition software and may include unintentional dictation errors.   Nathaniel Man, MD 06/03/22 [id number]

## 2022-07-25 NOTE — Progress Notes (Addendum)
SUBJECTIVE:   Chief Complaint  Patient presents with   Medical Management of Chronic Issues    Osteoporosis option/ chronic fatigue   HPI Patient presents to clinic for follow-up chronic disease management.  No acute concerns today.  Reports she has not been feeling well for about a month.  Reports had possible viral infection and is now starting to improve.  Reports she has not been taking medication as prescribed.   Osteopenia noted on bone density scan from 2020.  Had recommended at that time Prolia injections given high risk FRAX score.  Patient did not start Prolia injections.  Currently on calcium and vitamin D supplements but inconsistent with taking medication.  Denies any recent falls.  History of MUGA's Doing well.  Last follow-up with oncology 5/23.  Hyperlipidemia. On statin therapy.  Denies any myalgias.  Inconsistent with taking medication.    PERTINENT PMH / PSH: Chronic fatigue Mood disorder, depression History of MUGA's Osteopenia  OBJECTIVE:  BP 130/60   Pulse 90   Temp 98.2 F (36.8 C) (Oral)   Ht 4' 11.5" (1.511 m)   Wt 121 lb 12.8 oz (55.2 kg)   SpO2 99%   BMI 24.19 kg/m    Physical Exam Constitutional:      General: She is not in acute distress.    Appearance: She is normal weight. She is not ill-appearing.  HENT:     Head: Normocephalic.  Eyes:     Conjunctiva/sclera: Conjunctivae normal.  Cardiovascular:     Rate and Rhythm: Normal rate and regular rhythm.     Pulses: Normal pulses.  Pulmonary:     Effort: Pulmonary effort is normal.     Breath sounds: Normal breath sounds.  Abdominal:     General: Bowel sounds are normal.  Neurological:     Mental Status: She is alert. Mental status is at baseline.  Psychiatric:        Mood and Affect: Mood normal.        Behavior: Behavior normal.        Thought Content: Thought content normal.        Judgment: Judgment normal.     ASSESSMENT/PLAN:  Colon cancer  screening  Prediabetes Assessment & Plan: Chronic. Check A1c  Orders: -     Hemoglobin A1c; Future  Hyperlipidemia, unspecified hyperlipidemia type Assessment & Plan: Chronic.  On statin therapy.  No myalgias. Continue Pravachol 10 mg daily  Orders: -     Lipid panel; Future  Elevated vitamin B12 level -     Vitamin B12; Future  BMI 25.0-25.9,adult -     Comprehensive metabolic panel -     CBC with Differential/Platelet; Future -     Comprehensive metabolic panel; Future -     TSH; Future -     VITAMIN D 25 Hydroxy (Vit-D Deficiency, Fractures); Future  Postmenopausal estrogen deficiency -     DG Bone Density; Future  Osteopenia, unspecified location Assessment & Plan: Chronic.  Previous DEXA with T-score -2.3, FRAX score, hip fracture 35.2% and 21.6%.  Not currently on Prolia or bisphosphonate therapy Continue calcium and vitamin D supplements. Offered Prolia injection, patient prefers to wait for repeat DEXA Repeat DEXA and follow-up with patient once results.  Orders: -     Phosphorus  Need for pneumococcal 20-valent conjugate vaccination -     Pneumococcal conjugate vaccine 20-valent  Depression, recurrent ([place]) Assessment & Plan: Chronic.  Prescribed Celexa but not currently taking medication.  Denies any SI/HI Recommend  she restart Celexa 10 mg daily Encourage CBT Follow-up as needed.Marland Kitchen   MGUS (monoclonal gammopathy of unknown significance) Assessment & Plan: Follows with Dr. Janese Banks     PDMP reviewed  Return for PCP.  Carollee Leitz, MD

## 2022-07-26 ENCOUNTER — Ambulatory Visit: Payer: Medicare Other | Admitting: Family Medicine

## 2022-07-26 ENCOUNTER — Ambulatory Visit (INDEPENDENT_AMBULATORY_CARE_PROVIDER_SITE_OTHER): Payer: Medicare Other | Admitting: Family Medicine

## 2022-07-26 ENCOUNTER — Other Ambulatory Visit: Payer: Medicare Other

## 2022-07-26 VITALS — BP 130/60 | HR 90 | Temp 98.2°F | Ht 59.5 in | Wt 121.8 lb

## 2022-07-26 DIAGNOSIS — Z6825 Body mass index (BMI) 25.0-25.9, adult: Secondary | ICD-10-CM | POA: Diagnosis not present

## 2022-07-26 DIAGNOSIS — E785 Hyperlipidemia, unspecified: Secondary | ICD-10-CM | POA: Diagnosis not present

## 2022-07-26 DIAGNOSIS — Z1211 Encounter for screening for malignant neoplasm of colon: Secondary | ICD-10-CM

## 2022-07-26 DIAGNOSIS — Z780 Asymptomatic menopausal state: Secondary | ICD-10-CM

## 2022-07-26 DIAGNOSIS — M8580 Other specified disorders of bone density and structure, unspecified site: Secondary | ICD-10-CM

## 2022-07-26 DIAGNOSIS — Z23 Encounter for immunization: Secondary | ICD-10-CM | POA: Diagnosis not present

## 2022-07-26 DIAGNOSIS — F339 Major depressive disorder, recurrent, unspecified: Secondary | ICD-10-CM

## 2022-07-26 DIAGNOSIS — R748 Abnormal levels of other serum enzymes: Secondary | ICD-10-CM

## 2022-07-26 DIAGNOSIS — R7303 Prediabetes: Secondary | ICD-10-CM

## 2022-07-26 DIAGNOSIS — R7989 Other specified abnormal findings of blood chemistry: Secondary | ICD-10-CM

## 2022-07-26 DIAGNOSIS — D472 Monoclonal gammopathy: Secondary | ICD-10-CM

## 2022-07-26 DIAGNOSIS — M810 Age-related osteoporosis without current pathological fracture: Secondary | ICD-10-CM

## 2022-07-26 NOTE — Patient Instructions (Addendum)
It was a pleasure meeting you today. Thank you for allowing me to take part in your health care.  Our goals for today as we discussed include:  Restart Celexa 10 mg daily Restart Pravachol 10 mg daily Restart calcium and vitamin D daily   We will get some labs today.  If they are abnormal or we need to do something about them, I will call you.  If they are normal, I will send you a message on MyChart (if it is active) or a letter in the mail.  If you don't hear from [place] in 2 weeks, please call the office at the number below.   Received Pneumonia vaccine today  Schedule appointment for follow-up with PCP 4 to 6 weeks. Please bring in all of your current medications.  Next visit.  If you have any questions or concerns, please do not hesitate to call the office at [phone].  I look forward to our next visit and until then take care and stay safe.  Regards,   Carollee Leitz, MD   [facility]

## 2022-07-27 ENCOUNTER — Other Ambulatory Visit: Payer: Self-pay | Admitting: Family Medicine

## 2022-07-27 ENCOUNTER — Other Ambulatory Visit (INDEPENDENT_AMBULATORY_CARE_PROVIDER_SITE_OTHER): Payer: Medicare Other

## 2022-07-27 DIAGNOSIS — R748 Abnormal levels of other serum enzymes: Secondary | ICD-10-CM

## 2022-07-27 DIAGNOSIS — R7303 Prediabetes: Secondary | ICD-10-CM | POA: Diagnosis not present

## 2022-07-27 DIAGNOSIS — Z6825 Body mass index (BMI) 25.0-25.9, adult: Secondary | ICD-10-CM | POA: Diagnosis not present

## 2022-07-27 DIAGNOSIS — E785 Hyperlipidemia, unspecified: Secondary | ICD-10-CM

## 2022-07-27 LAB — COMPREHENSIVE METABOLIC PANEL
ALT: 18 U/L (ref 0–35)
AST: 23 U/L (ref 0–37)
Albumin: 4.2 g/dL (ref 3.5–5.2)
Alkaline Phosphatase: 126 U/L — ABNORMAL HIGH (ref 39–117)
BUN: 30 mg/dL — ABNORMAL HIGH (ref 6–23)
CO2: 25 mEq/L (ref 19–32)
Calcium: 9.8 mg/dL (ref 8.4–10.5)
Chloride: 103 mEq/L (ref 96–112)
Creatinine, Ser: 0.75 mg/dL (ref 0.40–1.20)
GFR: 75.76 mL/min (ref >60.00)
Glucose, Bld: 88 mg/dL (ref 70–99)
Potassium: 4.3 mEq/L (ref 3.5–5.1)
Sodium: 139 mEq/L (ref 135–145)
Total Bilirubin: 0.3 mg/dL (ref 0.2–1.2)
Total Protein: 7.3 g/dL (ref 6.0–8.3)

## 2022-07-27 LAB — LIPID PANEL
Cholesterol: 210 mg/dL — ABNORMAL HIGH (ref 0–200)
HDL: 62.2 mg/dL (ref >39.00)
LDL Cholesterol: 125 mg/dL — ABNORMAL HIGH (ref 0–99)
NonHDL: 147.89
Total CHOL/HDL Ratio: 3
Triglycerides: 114 mg/dL (ref 0.0–149.0)
VLDL: 22.8 mg/dL (ref 0.0–40.0)

## 2022-07-27 LAB — CBC WITH DIFFERENTIAL/PLATELET
Basophils Absolute: 0 10*3/uL (ref 0.0–0.1)
Basophils Relative: 0.4 % (ref 0.0–3.0)
Eosinophils Absolute: 0.1 10*3/uL (ref 0.0–0.7)
Eosinophils Relative: 1.6 % (ref 0.0–5.0)
HCT: 37.8 % (ref 36.0–46.0)
Hemoglobin: 12.6 g/dL (ref 12.0–15.0)
Lymphocytes Relative: 24.2 % (ref 12.0–46.0)
Lymphs Abs: 1.5 10*3/uL (ref 0.7–4.0)
MCHC: 33.3 g/dL (ref 30.0–36.0)
MCV: 92.5 fl (ref 78.0–100.0)
Monocytes Absolute: 0.6 10*3/uL (ref 0.1–1.0)
Monocytes Relative: 9.1 % (ref 3.0–12.0)
Neutro Abs: 3.9 10*3/uL (ref 1.4–7.7)
Neutrophils Relative %: 64.7 % (ref 43.0–77.0)
Platelets: 304 10*3/uL (ref 150.0–400.0)
RBC: 4.09 Mil/uL (ref 3.87–5.11)
RDW: 13.9 % (ref 11.5–15.5)
WBC: 6.1 10*3/uL (ref 4.0–10.5)

## 2022-07-27 LAB — HEMOGLOBIN A1C: Hgb A1c MFr Bld: 6 % (ref 4.6–6.5)

## 2022-07-27 LAB — VITAMIN B12: Vitamin B-12: 1412 pg/mL — ABNORMAL HIGH (ref 211–911)

## 2022-07-27 LAB — VITAMIN D 25 HYDROXY (VIT D DEFICIENCY, FRACTURES): VITD: 41.85 ng/mL (ref 30.00–100.00)

## 2022-07-27 LAB — TSH: TSH: 3.14 u[IU]/mL (ref 0.35–5.50)

## 2022-07-30 ENCOUNTER — Encounter: Payer: Self-pay | Admitting: Family Medicine

## 2022-07-30 NOTE — Addendum Note (Signed)
Addended by: Lanice Shirts on: 07/30/2022 04:06 PM   Modules accepted: Level of Service

## 2022-07-30 NOTE — Assessment & Plan Note (Signed)
Chronic Check A1c 

## 2022-07-30 NOTE — Assessment & Plan Note (Signed)
Chronic.  Prescribed Celexa but not currently taking medication.  Denies any SI/HI Recommend she restart Celexa 10 mg daily Encourage CBT Follow-up as needed.Marland Kitchen

## 2022-07-30 NOTE — Assessment & Plan Note (Signed)
Follows with Dr. Janese Banks

## 2022-07-30 NOTE — Assessment & Plan Note (Signed)
Chronic.  Previous DEXA with T-score -2.3, FRAX score, hip fracture 35.2% and 21.6%.  Not currently on Prolia or bisphosphonate therapy Continue calcium and vitamin D supplements. Offered Prolia injection, patient prefers to wait for repeat DEXA Repeat DEXA and follow-up with patient once results.

## 2022-07-30 NOTE — Assessment & Plan Note (Signed)
Chronic.  On statin therapy.  No myalgias. Continue Pravachol 10 mg daily

## 2022-09-01 ENCOUNTER — Encounter: Payer: Self-pay | Admitting: Family Medicine

## 2022-09-01 ENCOUNTER — Ambulatory Visit (INDEPENDENT_AMBULATORY_CARE_PROVIDER_SITE_OTHER): Payer: Medicare Other | Admitting: Family Medicine

## 2022-09-01 VITALS — BP 128/70 | HR 102 | Temp 98.5°F | Ht 59.5 in | Wt 123.6 lb

## 2022-09-01 DIAGNOSIS — R5382 Chronic fatigue, unspecified: Secondary | ICD-10-CM | POA: Diagnosis not present

## 2022-09-01 DIAGNOSIS — F339 Major depressive disorder, recurrent, unspecified: Secondary | ICD-10-CM

## 2022-09-01 DIAGNOSIS — Z1211 Encounter for screening for malignant neoplasm of colon: Secondary | ICD-10-CM

## 2022-09-01 MED ORDER — DULOXETINE HCL 20 MG PO CPEP
20.0000 mg | ORAL_CAPSULE | Freq: Every day | ORAL | 0 refills | Status: DC
Start: 2022-09-01 — End: 2022-12-28

## 2022-09-01 NOTE — Patient Instructions (Signed)
It was a pleasure meeting you today. Thank you for allowing me to take part in your health care.  Our goals for today as we discussed include:  We will get some labs today.  If they are abnormal or we need to do something about them, I will call you.  If they are normal, I will send you a message on MyChart (if it is active) or a letter in the mail.  If you don't hear from [place] in 2 weeks, please call the office at the number below.   Referral sent for colonoscopy  Referral sent for sleep studies  Start Duloxetine 20 mg daily, Stop Celexa   Follow up in 2 weeks  If you have any questions or concerns, please do not hesitate to call the office at [phone].  I look forward to our next visit and until then take care and stay safe.  Regards,   Dana Allan, MD   [facility]

## 2022-09-01 NOTE — Progress Notes (Addendum)
SUBJECTIVE:   Chief Complaint  Patient presents with   Fatigue    4-6 wk f/u on fatigue pt states its worse   HPI Patient presents to clinic for follow-up fatigue.  Chronic fatigue Reports worsening fatigue.  Has no interest in socializing.  Feels like she cannot get out of bed and sleeps 10 hours a day.  Has not restarted Celexa.  Denies any history of EtOH use, THC or recreational drug use.  Denies any tick bites, joint pain, muscle aches, fevers.  Symptoms been ongoing for 1 year.  Denies any chest pain, shortness of breath, hematuria, diarrhea, hematochezia.  PERTINENT PMH / PSH: Mood disorder Chronic fatigue MUGA's  OBJECTIVE:  BP 128/70   Pulse (!) 102   Temp 98.5 F (36.9 C)   Ht 4' 11.5" (1.511 m)   Wt 123 lb 9.6 oz (56.1 kg)   SpO2 98%   BMI 24.55 kg/m    Physical Exam Vitals reviewed.  Constitutional:      General: She is not in acute distress.    Appearance: Normal appearance. She is normal weight. She is not ill-appearing, toxic-appearing or diaphoretic.  Eyes:     General:        Right eye: No discharge.        Left eye: No discharge.     Conjunctiva/sclera: Conjunctivae normal.  Neck:     Thyroid: No thyromegaly or thyroid tenderness.  Cardiovascular:     Rate and Rhythm: Regular rhythm. Tachycardia present.     Heart sounds: Normal heart sounds.  Pulmonary:     Effort: Pulmonary effort is normal.     Breath sounds: Normal breath sounds.  Abdominal:     General: Bowel sounds are normal.  Musculoskeletal:        General: Normal range of motion.  Skin:    General: Skin is warm and dry.  Neurological:     General: No focal deficit present.     Mental Status: She is alert and oriented to person, place, and time. Mental status is at baseline.  Psychiatric:        Mood and Affect: Mood normal.        Behavior: Behavior normal.        Thought Content: Thought content normal.        Judgment: Judgment normal.       09/01/2022    2:23 PM  07/26/2022   11:55 AM 04/13/2022    2:14 PM 01/18/2022    3:02 PM 01/12/2022    2:09 PM  Depression screen PHQ 2/9  Decreased Interest 3 3 0 1 2  Down, Depressed, Hopeless 3 0 0 1 2  PHQ - 2 Score 6 3 0 2 4  Altered sleeping 3 3  1 1   Tired, decreased energy 3 3  1 3   Change in appetite 3 3  1 2   Feeling bad or failure about yourself  0 0  0 0  Trouble concentrating 2 0  2 2  Moving slowly or fidgety/restless 0 0  0 0  Suicidal thoughts 0 0  0 0  PHQ-9 Score 17 12  7 12   Difficult doing work/chores Extremely dIfficult Very difficult  Not difficult at all Somewhat difficult        09/01/2022    2:24 PM 07/26/2022   11:56 AM 01/12/2022    3:38 PM  GAD 7 : Generalized Anxiety Score  Nervous, Anxious, on Edge 0 0 1  Control/stop worrying 0  0 0  Worry too much - different things 0 0 1  Trouble relaxing 0 0 1  Restless 0 0 0  Easily annoyed or irritable 0 0 0  Afraid - awful might happen 0 0 0  Total GAD 7 Score 0 0 3  Anxiety Difficulty Extremely difficult Not difficult at all Somewhat difficult      ASSESSMENT/PLAN:  Chronic fatigue Assessment & Plan: Chronic.  Progressive.  Self discontinued SSRI.  Denies any SI/HI.  PHQ-9 elevated. Offered psychiatry referral, patient declined Start duloxetine 20 mg daily Discontinue Celexa Encourage CBT Check labs for autoimmune etiology, CK and UDS. Referral sent to pulmonology for sleep studies Follow-up in 2 weeks Strict return precautions provided  Orders: -     ANA -     Rheumatoid factor -     Sedimentation rate -     ToxASSURE Select 13 (MW), Urine -     HIV Antibody (routine testing w rflx) -     CK -     Ambulatory referral to Pulmonology  Colon cancer screening Assessment & Plan: High risk history given ulcerative colitis. Referral sent to GI for colonoscopy.  Orders: -     Ambulatory referral to Gastroenterology  Depression, recurrent [facility]) Assessment & Plan: Chronic.  Previously prescribed Celexa but not  currently taking medication.  Had recommended at last visit to restart Celexa back has not taken medication.  Denies any SI/HI Discontinue Celexa Start duloxetine 20 mg daily Encourage CBT Follow-up in 2 weeks  Orders: -     DULoxetine HCl; Take 1 capsule (20 mg total) by mouth daily.  Dispense: 90 capsule; Refill: 0   PDMP reviewed  Return in about 2 weeks (around 09/15/2022) for PCP.  Dana Allan, MD

## 2022-09-02 LAB — CK: Total CK: 71 U/L (ref 7–177)

## 2022-09-02 LAB — SEDIMENTATION RATE: Sed Rate: 40 mm/hr — ABNORMAL HIGH (ref 0–30)

## 2022-09-03 LAB — ANA: Anti Nuclear Antibody (ANA): NEGATIVE

## 2022-09-03 LAB — HIV ANTIBODY (ROUTINE TESTING W REFLEX): HIV 1&2 Ab, 4th Generation: NONREACTIVE

## 2022-09-03 LAB — RHEUMATOID FACTOR: Rheumatoid fact SerPl-aCnc: <10 IU/mL (ref <14)

## 2022-09-07 LAB — TOXASSURE SELECT 13 (MW), URINE

## 2022-09-09 DIAGNOSIS — J301 Allergic rhinitis due to pollen: Secondary | ICD-10-CM | POA: Diagnosis not present

## 2022-09-10 ENCOUNTER — Encounter: Payer: Self-pay | Admitting: *Deleted

## 2022-09-12 ENCOUNTER — Encounter: Payer: Self-pay | Admitting: Family Medicine

## 2022-09-12 NOTE — Assessment & Plan Note (Signed)
Chronic.  Previously prescribed Celexa but not currently taking medication.  Had recommended at last visit to restart Celexa back has not taken medication.  Denies any SI/HI Discontinue Celexa Start duloxetine 20 mg daily Encourage CBT Follow-up in 2 weeks

## 2022-09-12 NOTE — Assessment & Plan Note (Signed)
High risk history given ulcerative colitis. Referral sent to GI for colonoscopy.

## 2022-09-12 NOTE — Assessment & Plan Note (Addendum)
Chronic.  Progressive.  Self discontinued SSRI.  Denies any SI/HI.  PHQ-9 elevated. Offered psychiatry referral, patient declined Start duloxetine 20 mg daily Discontinue Celexa Encourage CBT Check labs for autoimmune etiology, CK and UDS. Referral sent to pulmonology for sleep studies Follow-up in 2 weeks Strict return precautions provided

## 2022-09-15 ENCOUNTER — Ambulatory Visit: Payer: Medicare Other | Admitting: Family Medicine

## 2022-09-27 ENCOUNTER — Inpatient Hospital Stay: Payer: Medicare Other | Attending: Oncology

## 2022-09-27 DIAGNOSIS — D472 Monoclonal gammopathy: Secondary | ICD-10-CM | POA: Diagnosis not present

## 2022-09-27 LAB — COMPREHENSIVE METABOLIC PANEL
ALT: 23 U/L (ref 0–44)
AST: 27 U/L (ref 15–41)
Albumin: 4.4 g/dL (ref 3.5–5.0)
Alkaline Phosphatase: 111 U/L (ref 38–126)
Anion gap: 11 (ref 5–15)
BUN: 28 mg/dL — ABNORMAL HIGH (ref 8–23)
CO2: 25 mmol/L (ref 22–32)
Calcium: 9.5 mg/dL (ref 8.9–10.3)
Chloride: 101 mmol/L (ref 98–111)
Creatinine, Ser: 0.91 mg/dL (ref 0.44–1.00)
GFR, Estimated: >60 mL/min (ref >60)
Glucose, Bld: 116 mg/dL — ABNORMAL HIGH (ref 70–99)
Potassium: 4.1 mmol/L (ref 3.5–5.1)
Sodium: 137 mmol/L (ref 135–145)
Total Bilirubin: 0.5 mg/dL (ref 0.3–1.2)
Total Protein: 8.2 g/dL — ABNORMAL HIGH (ref 6.5–8.1)

## 2022-09-27 LAB — CBC WITH DIFFERENTIAL/PLATELET
Abs Immature Granulocytes: 0.02 10*3/uL (ref 0.00–0.07)
Basophils Absolute: 0 10*3/uL (ref 0.0–0.1)
Basophils Relative: 0 %
Eosinophils Absolute: 0.1 10*3/uL (ref 0.0–0.5)
Eosinophils Relative: 1 %
HCT: 39.2 % (ref 36.0–46.0)
Hemoglobin: 13 g/dL (ref 12.0–15.0)
Immature Granulocytes: 0 %
Lymphocytes Relative: 25 %
Lymphs Abs: 1.5 10*3/uL (ref 0.7–4.0)
MCH: 30.4 pg (ref 26.0–34.0)
MCHC: 33.2 g/dL (ref 30.0–36.0)
MCV: 91.8 fL (ref 80.0–100.0)
Monocytes Absolute: 0.4 10*3/uL (ref 0.1–1.0)
Monocytes Relative: 7 %
Neutro Abs: 4 10*3/uL (ref 1.7–7.7)
Neutrophils Relative %: 67 %
Platelets: 271 10*3/uL (ref 150–400)
RBC: 4.27 MIL/uL (ref 3.87–5.11)
RDW: 13 % (ref 11.5–15.5)
WBC: 5.9 10*3/uL (ref 4.0–10.5)
nRBC: 0 % (ref 0.0–0.2)

## 2022-09-28 ENCOUNTER — Ambulatory Visit: Payer: Medicare Other | Admitting: Family Medicine

## 2022-09-28 LAB — KAPPA/LAMBDA LIGHT CHAINS
Kappa free light chain: 82.2 mg/L — ABNORMAL HIGH (ref 3.3–19.4)
Kappa, lambda light chain ratio: 6.42 — ABNORMAL HIGH (ref 0.26–1.65)
Lambda free light chains: 12.8 mg/L (ref 5.7–26.3)

## 2022-10-01 ENCOUNTER — Ambulatory Visit
Admission: RE | Admit: 2022-10-01 | Discharge: 2022-10-01 | Disposition: A | Discharge status: Home / Self Care | Payer: Medicare Other | Attending: Family Medicine | Admitting: Family Medicine

## 2022-10-01 ENCOUNTER — Ambulatory Visit
Admission: RE | Admit: 2022-10-01 | Discharge: 2022-10-01 | Disposition: A | Discharge status: Home / Self Care | Payer: Medicare Other | Source: Ambulatory Visit | Attending: Oncology | Admitting: Oncology

## 2022-10-01 DIAGNOSIS — D472 Monoclonal gammopathy: Secondary | ICD-10-CM | POA: Insufficient documentation

## 2022-10-01 DIAGNOSIS — M170 Bilateral primary osteoarthritis of knee: Secondary | ICD-10-CM | POA: Diagnosis not present

## 2022-10-05 LAB — MULTIPLE MYELOMA PANEL, SERUM
Albumin SerPl Elph-Mcnc: 3.9 g/dL (ref 2.9–4.4)
Albumin/Glob SerPl: 1.1 (ref 0.7–1.7)
Alpha 1: 0.3 g/dL (ref 0.0–0.4)
Alpha2 Glob SerPl Elph-Mcnc: 0.9 g/dL (ref 0.4–1.0)
B-Globulin SerPl Elph-Mcnc: 1 g/dL (ref 0.7–1.3)
Gamma Glob SerPl Elph-Mcnc: 1.5 g/dL (ref 0.4–1.8)
Globulin, Total: 3.7 g/dL (ref 2.2–3.9)
IgA: 43 mg/dL — ABNORMAL LOW (ref 64–422)
IgG (Immunoglobin G), Serum: 1643 mg/dL — ABNORMAL HIGH (ref 586–1602)
IgM (Immunoglobulin M), Srm: 39 mg/dL (ref 26–217)
M Protein SerPl Elph-Mcnc: 1 g/dL — ABNORMAL HIGH
Total Protein ELP: 7.6 g/dL (ref 6.0–8.5)

## 2022-10-07 ENCOUNTER — Ambulatory Visit: Payer: Medicare Other | Admitting: Family Medicine

## 2022-10-13 DIAGNOSIS — H43812 Vitreous degeneration, left eye: Secondary | ICD-10-CM | POA: Diagnosis not present

## 2022-10-13 DIAGNOSIS — E113293 Type 2 diabetes mellitus with mild nonproliferative diabetic retinopathy without macular edema, bilateral: Secondary | ICD-10-CM | POA: Diagnosis not present

## 2022-10-13 DIAGNOSIS — Z961 Presence of intraocular lens: Secondary | ICD-10-CM | POA: Diagnosis not present

## 2022-10-13 DIAGNOSIS — H524 Presbyopia: Secondary | ICD-10-CM | POA: Diagnosis not present

## 2022-10-13 DIAGNOSIS — Z0100 Encounter for examination of eyes and vision without abnormal findings: Secondary | ICD-10-CM | POA: Diagnosis not present

## 2022-10-13 LAB — HM DIABETES EYE EXAM

## 2022-10-21 ENCOUNTER — Other Ambulatory Visit: Payer: Self-pay | Admitting: Family Medicine

## 2022-10-21 DIAGNOSIS — Z1231 Encounter for screening mammogram for malignant neoplasm of breast: Secondary | ICD-10-CM

## 2022-12-16 ENCOUNTER — Ambulatory Visit
Admission: RE | Admit: 2022-12-16 | Discharge: 2022-12-16 | Disposition: A | Discharge status: Home / Self Care | Payer: Medicare Other | Source: Ambulatory Visit | Attending: Family Medicine | Admitting: Family Medicine

## 2022-12-16 DIAGNOSIS — Z1231 Encounter for screening mammogram for malignant neoplasm of breast: Secondary | ICD-10-CM

## 2022-12-16 DIAGNOSIS — Z780 Asymptomatic menopausal state: Secondary | ICD-10-CM | POA: Diagnosis not present

## 2022-12-16 DIAGNOSIS — M8588 Other specified disorders of bone density and structure, other site: Secondary | ICD-10-CM | POA: Diagnosis not present

## 2022-12-20 ENCOUNTER — Ambulatory Visit (INDEPENDENT_AMBULATORY_CARE_PROVIDER_SITE_OTHER): Payer: Medicare Other | Admitting: Dermatology

## 2022-12-20 ENCOUNTER — Encounter: Payer: Self-pay | Admitting: Dermatology

## 2022-12-20 VITALS — BP 120/80 | HR 70

## 2022-12-20 DIAGNOSIS — L601 Onycholysis: Secondary | ICD-10-CM

## 2022-12-20 DIAGNOSIS — L570 Actinic keratosis: Secondary | ICD-10-CM | POA: Diagnosis not present

## 2022-12-20 DIAGNOSIS — D229 Melanocytic nevi, unspecified: Secondary | ICD-10-CM

## 2022-12-20 DIAGNOSIS — W908XXA Exposure to other nonionizing radiation, initial encounter: Secondary | ICD-10-CM | POA: Diagnosis not present

## 2022-12-20 DIAGNOSIS — D1801 Hemangioma of skin and subcutaneous tissue: Secondary | ICD-10-CM

## 2022-12-20 DIAGNOSIS — Z872 Personal history of diseases of the skin and subcutaneous tissue: Secondary | ICD-10-CM

## 2022-12-20 DIAGNOSIS — L821 Other seborrheic keratosis: Secondary | ICD-10-CM

## 2022-12-20 DIAGNOSIS — R233 Spontaneous ecchymoses: Secondary | ICD-10-CM

## 2022-12-20 DIAGNOSIS — Z1283 Encounter for screening for malignant neoplasm of skin: Secondary | ICD-10-CM | POA: Diagnosis not present

## 2022-12-20 DIAGNOSIS — B965 Pseudomonas (aeruginosa) (mallei) (pseudomallei) as the cause of diseases classified elsewhere: Secondary | ICD-10-CM

## 2022-12-20 DIAGNOSIS — L578 Other skin changes due to chronic exposure to nonionizing radiation: Secondary | ICD-10-CM

## 2022-12-20 DIAGNOSIS — S90212A Contusion of left great toe with damage to nail, initial encounter: Secondary | ICD-10-CM

## 2022-12-20 DIAGNOSIS — Z85828 Personal history of other malignant neoplasm of skin: Secondary | ICD-10-CM

## 2022-12-20 DIAGNOSIS — A498 Other bacterial infections of unspecified site: Secondary | ICD-10-CM

## 2022-12-20 DIAGNOSIS — L814 Other melanin hyperpigmentation: Secondary | ICD-10-CM

## 2022-12-20 NOTE — Progress Notes (Signed)
Follow-Up Visit   Subjective  Jamie Todd is a 82 y.o. female who presents for the following: Skin Cancer Screening and Full Body Skin Exam. Hx of BCC, Hx of SCC. Hx of actinic keratoses.   The patient presents for Total-Body Skin Exam (TBSE) for skin cancer screening and mole check. The patient has spots, moles and lesions to be evaluated, some may be new or changing and the patient may have concern these could be cancer.    The following portions of the chart were reviewed this encounter and updated as appropriate: medications, allergies, medical history  Review of Systems:  No other skin or systemic complaints except as noted in HPI or Assessment and Plan.  Objective  Well appearing patient in no apparent distress; mood and affect are within normal limits.  A full examination was performed including scalp, head, eyes, ears, nose, lips, neck, chest, axillae, abdomen, back, buttocks, bilateral upper extremities, bilateral lower extremities, hands, feet, fingers, toes, fingernails, and toenails. All findings within normal limits unless otherwise noted below.   Exam of nails limited by presence of nail polish.  Relevant physical exam findings are noted in the Assessment and Plan.  right nasal tip x1 Erythematous thin papules/macules with gritty scale.     Assessment & Plan   HISTORY OF BASAL CELL CARCINOMA OF THE SKIN. Right frontal scalp within hair line. Nodular. Excised 06/18/2014, margins free  - No evidence of recurrence today - Recommend regular full body skin exams - Recommend daily broad spectrum sunscreen SPF 30+ to sun-exposed areas, reapply every 2 hours as needed.  - Call if any new or changing lesions are noted between office visits  HISTORY OF SQUAMOUS CELL CARCINOMA IN SITU OF THE SKIN. Right lat. lower leg. SCCis. 07/19/2008.  - No evidence of recurrence today - Recommend regular full body skin exams - Recommend daily broad spectrum sunscreen SPF 30+ to  sun-exposed areas, reapply every 2 hours as needed.  - Call if any new or changing lesions are noted between office visits   SKIN CANCER SCREENING PERFORMED TODAY.  ACTINIC DAMAGE - Chronic condition, secondary to cumulative UV/sun exposure - diffuse scaly erythematous macules with underlying dyspigmentation - Recommend daily broad spectrum sunscreen SPF 30+ to sun-exposed areas, reapply every 2 hours as needed.  - Staying in the shade or wearing long sleeves, sun glasses (UVA+UVB protection) and wide brim hats (4-inch brim around the entire circumference of the hat) are also recommended for sun protection.  - Call for new or changing lesions.  LENTIGINES, SEBORRHEIC KERATOSES, HEMANGIOMAS - Benign normal skin lesions - Benign-appearing - Call for any changes  MELANOCYTIC NEVI - Tan-brown and/or pink-flesh-colored symmetric macules and papules - Benign appearing on exam today - Observation - Call clinic for new or changing moles - Recommend daily use of broad spectrum spf 30+ sunscreen to sun-exposed areas.  - Check toenails when remove polish.   SEBORRHEIC KERATOSIS - Stuck-on, waxy, tan-brown papules and/or plaques  - Benign-appearing - Discussed benign etiology and prognosis. - Observe - Call for any changes Left and right lower posterior legs, left forearm, B/L cheeks.   Onycholysis and talon noir with Nail Pseudomonas  Removed nail polish to examine nail with reported lesion Upon questioning, patient recalls trauma to the toe 2/2 pet dog 2-3 months ago  Exam: Separation at nail bed with dried subungual hemorrhage and greenish discoloration of detached distal lateral nail of left first toenail  Treatment: Soak toe 3 times daily in diluted white  vinegar to treat pseudomonas 1 Tbs in 1 cup of warm water. Ensure lesions continue to grow out with nail. Will likely take 1 year for new nail to fully grow in  AK (actinic keratosis) right nasal tip x1  Actinic keratoses are  precancerous spots that appear secondary to cumulative UV radiation exposure/sun exposure over time. They are chronic with expected duration over 1 year. A portion of actinic keratoses will progress to squamous cell carcinoma of the skin. It is not possible to reliably predict which spots will progress to skin cancer and so treatment is recommended to prevent development of skin cancer.  Recommend daily broad spectrum sunscreen SPF 30+ to sun-exposed areas, reapply every 2 hours as needed.  Recommend staying in the shade or wearing long sleeves, sun glasses (UVA+UVB protection) and wide brim hats (4-inch brim around the entire circumference of the hat). Call for new or changing lesions.  Destruction of lesion - right nasal tip x1  Destruction method: cryotherapy   Informed consent: discussed and consent obtained   Lesion destroyed using liquid nitrogen: Yes   Region frozen until ice ball extended beyond lesion: Yes   Outcome: patient tolerated procedure well with no complications   Post-procedure details: wound care instructions given   Additional details:  Prior to procedure, discussed risks of blister formation, small wound, skin dyspigmentation, or rare scar following cryotherapy. Recommend Vaseline ointment to treated areas while healing.    Return in about 1 year (around 12/20/2023) for TBSE, HxBCC, HxSCC.  I, Lawson Radar, CMA, am acting as scribe for Elie Goody, MD.   Documentation: I have reviewed the above documentation for accuracy and completeness, and I agree with the above.  Elie Goody, MD

## 2022-12-20 NOTE — Patient Instructions (Addendum)
Toenail: Soak toe 3 times daily in diluted white vinegar.  1 Tbs in 1 cup of warm water.   Cryotherapy Aftercare  Wash gently with soap and water everyday.   Apply Vaseline Jelly daily until healed.   Recommend daily broad spectrum sunscreen SPF 30+ to sun-exposed areas, reapply every 2 hours as needed. Call for new or changing lesions.  Staying in the shade or wearing long sleeves, sun glasses (UVA+UVB protection) and wide brim hats (4-inch brim around the entire circumference of the hat) are also recommended for sun protection.    Melanoma ABCDEs  Melanoma is the most dangerous type of skin cancer, and is the leading cause of death from skin disease.  You are more likely to develop melanoma if you: Have light-colored skin, light-colored eyes, or red or blond hair Spend a lot of time in the sun Tan regularly, either outdoors or in a tanning bed Have had blistering sunburns, especially during childhood Have a close family member who has had a melanoma Have atypical moles or large birthmarks  Early detection of melanoma is key since treatment is typically straightforward and cure rates are extremely high if we catch it early.   The first sign of melanoma is often a change in a mole or a new dark spot.  The ABCDE system is a way of remembering the signs of melanoma.  A for asymmetry:  The two halves do not match. B for border:  The edges of the growth are irregular. C for color:  A mixture of colors are present instead of an even brown color. D for diameter:  Melanomas are usually (but not always) greater than 6mm - the size of a pencil eraser. E for evolution:  The spot keeps changing in size, shape, and color.  Please check your skin once per month between visits. You can use a small mirror in front and a large mirror behind you to keep an eye on the back side or your body.   If you see any new or changing lesions before your next follow-up, please call to schedule a  visit.  Please continue daily skin protection including broad spectrum sunscreen SPF 30+ to sun-exposed areas, reapplying every 2 hours as needed when you're outdoors.   Staying in the shade or wearing long sleeves, sun glasses (UVA+UVB protection) and wide brim hats (4-inch brim around the entire circumference of the hat) are also recommended for sun protection.    Due to recent changes in healthcare laws, you may see results of your pathology and/or laboratory studies on MyChart before the doctors have had a chance to review them. We understand that in some cases there may be results that are confusing or concerning to you. Please understand that not all results are received at the same time and often the doctors may need to interpret multiple results in order to provide you with the best plan of care or course of treatment. Therefore, we ask that you please give [place] 2 business days to thoroughly review all your results before contacting the office for clarification. Should we see a critical lab result, you will be contacted sooner.   If You Need Anything After Your Visit  If you have any questions or concerns for your doctor, please call our main line at [phone] and press option 4 to reach your doctor's medical assistant. If no one answers, please leave a voicemail as directed and we will return your call as soon as possible. Messages left after  4 pm will be answered the following business day.   You may also send [place] a message via MyChart. We typically respond to MyChart messages within 1-2 business days.  For prescription refills, please ask your pharmacy to contact our office. Our fax number is [phone].  If you have an urgent issue when the clinic is closed that cannot wait until the next business day, you can page your doctor at the number below.    Please note that while we do our best to be available for urgent issues outside of office hours, we are not available 24/7.   If you  have an urgent issue and are unable to reach [place], you may choose to seek medical care at your doctor's office, retail clinic, urgent care center, or emergency room.  If you have a medical emergency, please immediately call 911 or go to the emergency department.  Pager Numbers  - Dr. Gwen Pounds: [phone]  - Dr. Roseanne Reno: [phone]  - Dr. Katrinka Blazing: [phone]   In the event of inclement weather, please call our main line at [phone] for an update on the status of any delays or closures.  Dermatology Medication Tips: Please keep the boxes that topical medications come in in order to help keep track of the instructions about where and how to use these. Pharmacies typically print the medication instructions only on the boxes and not directly on the medication tubes.   If your medication is too expensive, please contact our office at [phone] option 4 or send [place] a message through MyChart.   We are unable to tell what your co-pay for medications will be in advance as this is different depending on your insurance coverage. However, we may be able to find a substitute medication at lower cost or fill out paperwork to get insurance to cover a needed medication.   If a prior authorization is required to get your medication covered by your insurance company, please allow [place] 1-2 business days to complete this process.  Drug prices often vary depending on where the prescription is filled and some pharmacies may offer cheaper prices.  The website www.goodrx.com contains coupons for medications through different pharmacies. The prices here do not account for what the cost may be with help from insurance (it may be cheaper with your insurance), but the website can give you the price if you did not use any insurance.  - You can print the associated coupon and take it with your prescription to the pharmacy.  - You may also stop by our office during regular business hours and pick up a GoodRx coupon  card.  - If you need your prescription sent electronically to a different pharmacy, notify our office through [facility] or by phone at [phone] option 4.     Si Usted Necesita Algo Despus de Su Visita  Tambin puede enviarnos un mensaje a travs de Clinical cytogeneticist. Por lo general respondemos a los mensajes de MyChart en el transcurso de 1 a 2 das hbiles.  Para renovar recetas, por favor pida a su farmacia que se ponga en contacto con nuestra oficina. Annie Sable de fax es [place] [phone].  Si tiene un asunto urgente cuando la clnica est cerrada y que no puede esperar hasta el siguiente da hbil, puede llamar/localizar a su doctor(a) al nmero que aparece a continuacin.   Por favor, tenga en cuenta que aunque hacemos todo lo posible para estar disponibles para asuntos urgentes fuera del horario de oficina, no estamos disponibles  las 24 horas del da, los 7 [address]  [address] de la [place].   Si tiene un problema urgente y no puede comunicarse con nosotros, puede optar por buscar atencin mdica  en el consultorio de su doctor(a), en una clnica privada, en un centro de atencin urgente o en una sala de emergencias.  Si tiene Engineer, drilling, por favor llame inmediatamente al 911 o vaya a la sala de emergencias.  Nmeros de bper  - Dr. Gwen Pounds: [phone]  - Dra. Roseanne Reno: [phone]  - Dr. Katrinka Blazing: [phone]   En caso de inclemencias del tiempo, por favor llame a Lacy Duverney principal al [phone] para una actualizacin sobre el [place] de cualquier retraso o cierre.  Consejos para la medicacin en dermatologa: Por favor, guarde las cajas en las que vienen los medicamentos de uso tpico para ayudarle a seguir las instrucciones sobre dnde y cmo usarlos. Las farmacias generalmente imprimen las instrucciones del medicamento slo en las cajas y no directamente en los tubos del [place].   Si su medicamento es muy caro, por favor, pngase en contacto con Rolm Gala llamando al [phone] y presione la opcin 4 o envenos un mensaje a travs de Clinical cytogeneticist.   No podemos decirle cul ser su copago por los medicamentos por adelantado ya que esto es diferente dependiendo de la cobertura de su seguro. Sin embargo, es posible que podamos encontrar un medicamento sustituto a Audiological scientist un formulario para que el seguro cubra el medicamento que se considera necesario.   Si se requiere una autorizacin previa para que su compaa de seguros [place] su medicamento, por favor permtanos de 1 a 2 das hbiles para completar [address]  Los precios de los medicamentos varan con frecuencia dependiendo del Environmental consultant de dnde se surte la receta y alguna farmacias pueden ofrecer precios ms baratos.  El sitio web www.goodrx.com tiene cupones para medicamentos de Health and safety inspector. Los precios aqu no tienen en cuenta lo que podra costar con la ayuda del seguro (puede ser ms barato con su seguro), pero el sitio web puede darle el precio si no utiliz Tourist information centre manager.  - Puede imprimir el cupn correspondiente y llevarlo con su receta a la farmacia.  - Tambin puede pasar por nuestra oficina durante el horario de atencin regular y Education officer, museum una tarjeta de cupones de GoodRx.  - Si necesita que su receta se enve electrnicamente a una farmacia diferente, informe a nuestra oficina a travs de MyChart de [place] o por telfono llamando al [phone] y presione la opcin 4.

## 2022-12-28 ENCOUNTER — Ambulatory Visit (INDEPENDENT_AMBULATORY_CARE_PROVIDER_SITE_OTHER): Payer: Medicare Other | Admitting: Family Medicine

## 2022-12-28 ENCOUNTER — Encounter: Payer: Self-pay | Admitting: Family Medicine

## 2022-12-28 VITALS — BP 122/60 | HR 100 | Temp 98.0°F | Resp 16 | Ht 59.0 in | Wt 127.1 lb

## 2022-12-28 DIAGNOSIS — F339 Major depressive disorder, recurrent, unspecified: Secondary | ICD-10-CM | POA: Diagnosis not present

## 2022-12-28 DIAGNOSIS — G9332 Myalgic encephalomyelitis/chronic fatigue syndrome: Secondary | ICD-10-CM

## 2022-12-28 DIAGNOSIS — R5382 Chronic fatigue, unspecified: Secondary | ICD-10-CM

## 2022-12-28 MED ORDER — DULOXETINE HCL 30 MG PO CPEP
30.0000 mg | ORAL_CAPSULE | Freq: Every day | ORAL | 0 refills | Status: AC
Start: 2022-12-28 — End: ?

## 2022-12-28 NOTE — Progress Notes (Signed)
SUBJECTIVE:   Chief Complaint  Patient presents with   Fatigue    Had chronic fatigue in the 60's Been fatigue all week but over the last 2 weeks is has gotten worse.   HPI Patient presents to clinic for follow-up fatigue.  Chronic fatigue Reports worsening fatigue.  Has no interest in socializing.  Feels like she cannot get out of bed and sleeps 10 hours a day.  Has difficulty getting out of bed to feed animals.  Denies any history of EtOH use, THC or recreational drug use.  Denies any tick bites, joint pain, muscle aches, fevers.  Symptoms been ongoing for years.  Does not think has depression and stopped Celexa.  Denies any chest pain, shortness of breath, hematuria, diarrhea, hematochezia.  She brings with her today a letter dated 01/1991 from former PCP with diagnosis of chronic fatigue syndrome and a list of medications that were recommended.  She is requesting B 12 injection, Kutatression injection, Gamma Globin injection and Gammar injection.    PERTINENT PMH / PSH: Mood disorder Chronic fatigue MUGA's  OBJECTIVE:  BP 122/60   Pulse 100   Temp 98 F (36.7 C)   Resp 16   Ht 4\' 11"  (1.499 m)   Wt 127 lb 2 oz (57.7 kg)   SpO2 100%   BMI 25.68 kg/m    Physical Exam Vitals reviewed.  Constitutional:      General: She is not in acute distress.    Appearance: Normal appearance. She is normal weight. She is not ill-appearing, toxic-appearing or diaphoretic.  Eyes:     General:        Right eye: No discharge.        Left eye: No discharge.     Conjunctiva/sclera: Conjunctivae normal.  Neck:     Thyroid: No thyromegaly or thyroid tenderness.  Cardiovascular:     Rate and Rhythm: Regular rhythm. Tachycardia present.     Heart sounds: Normal heart sounds.  Pulmonary:     Effort: Pulmonary effort is normal.     Breath sounds: Normal breath sounds.  Abdominal:     General: Bowel sounds are normal.  Musculoskeletal:        General: Normal range of motion.  Skin:     General: Skin is warm and dry.  Neurological:     General: No focal deficit present.     Mental Status: She is alert and oriented to person, place, and time. Mental status is at baseline.  Psychiatric:        Mood and Affect: Mood normal.        Behavior: Behavior normal.        Thought Content: Thought content normal.        Judgment: Judgment normal.       12/28/2022    2:45 PM 09/01/2022    2:23 PM 07/26/2022   11:55 AM 04/13/2022    2:14 PM 01/18/2022    3:02 PM  Depression screen PHQ 2/9  Decreased Interest 3 3 3  0 1  Down, Depressed, Hopeless 2 3 0 0 1  PHQ - 2 Score 5 6 3  0 2  Altered sleeping 3 3 3  1   Tired, decreased energy 3 3 3  1   Change in appetite 3 3 3  1   Feeling bad or failure about yourself  2 0 0  0  Trouble concentrating 0 2 0  2  Moving slowly or fidgety/restless 0 0 0  0  Suicidal thoughts  0 0 0  0  PHQ-9 Score 16 17 12  7   Difficult doing work/chores Extremely dIfficult Extremely dIfficult Very difficult  Not difficult at all        12/28/2022    2:45 PM 09/01/2022    2:24 PM 07/26/2022   11:56 AM 01/12/2022    3:38 PM  GAD 7 : Generalized Anxiety Score  Nervous, Anxious, on Edge 0 0 0 1  Control/stop worrying 0 0 0 0  Worry too much - different things 0 0 0 1  Trouble relaxing 0 0 0 1  Restless 0 0 0 0  Easily annoyed or irritable 0 0 0 0  Afraid - awful might happen 0 0 0 0  Total GAD 7 Score 0 0 0 3  Anxiety Difficulty Not difficult at all Extremely difficult Not difficult at all Somewhat difficult      ASSESSMENT/PLAN:  Chronic fatigue -     DULoxetine HCl; Take 1 capsule (30 mg total) by mouth daily.  Dispense: 30 capsule; Refill: 0 -     Ambulatory referral to Rheumatology  Depression, recurrent (HCC) Assessment & Plan: Chronic.  Previously prescribed Celexa but not currently taking medication.  Had recommended at last visit to restart Celexa back has not taken medication.  Denies any SI/HI Discontinue Celexa Start duloxetine 30 mg  daily Encourage CBT Follow-up in 2 weeks   Myalgic encephalomyelitis/chronic fatigue syndrome (ME/CFS) -     Ambulatory referral to Rheumatology    PDMP reviewed  Return in 4 weeks (on 01/25/2023), or if symptoms worsen or fail to improve, for PCP.  Dana Allan, MD

## 2022-12-28 NOTE — Patient Instructions (Addendum)
It was a pleasure meeting you today. Thank you for allowing me to take part in your health care.  Our goals for today as we discussed include:  Start Duloxetine 30 mg daily   Your recent B 12 level was high. No indication for B 12 injections.  If you have any questions or concerns, please do not hesitate to call the office at [phone].  I look forward to our next visit and until then take care and stay safe.  Regards,   Dana Allan, MD   [facility]

## 2023-01-15 ENCOUNTER — Encounter: Payer: Self-pay | Admitting: Family Medicine

## 2023-01-15 NOTE — Assessment & Plan Note (Addendum)
Chronic.  Previously prescribed Celexa but not currently taking medication.  Had recommended at last visit to restart Celexa back has not taken medication.  Denies any SI/HI Discontinue Celexa Start duloxetine 30 mg daily Encourage CBT Follow-up in 2 weeks

## 2023-01-15 NOTE — Addendum Note (Signed)
Addended by: Enid Cutter on: 01/15/2023 01:49 PM   Modules accepted: Orders

## 2023-01-25 ENCOUNTER — Ambulatory Visit: Payer: Medicare Other | Admitting: Family Medicine

## 2023-02-01 ENCOUNTER — Other Ambulatory Visit: Payer: Self-pay

## 2023-02-02 MED ORDER — PRAVASTATIN SODIUM 10 MG PO TABS: 10.0000 mg | ORAL_TABLET | Freq: Every day | ORAL | 3 refills | Status: AC

## 2023-02-15 ENCOUNTER — Encounter: Payer: Self-pay | Admitting: Family Medicine

## 2023-03-21 ENCOUNTER — Telehealth: Payer: Self-pay | Admitting: Family Medicine

## 2023-03-21 NOTE — Telephone Encounter (Signed)
Copied from CRM [id number]. Topic: Medicare AWV >> Mar 21, 2023 11:16 AM Payton Doughty wrote: Reason for CRM: Called 03/21/2023 to sched Annual Wellness Visit - MAILBOX FULL  Verlee Rossetti; Care Guide Ambulatory Clinical Support  l [facility] Direct Dial: [phone]

## 2023-03-24 ENCOUNTER — Encounter: Payer: Medicare Other | Admitting: Dermatology

## 2023-03-30 ENCOUNTER — Ambulatory Visit: Payer: Medicare Other | Admitting: Oncology

## 2023-03-30 ENCOUNTER — Other Ambulatory Visit: Payer: Medicare Other

## 2023-04-01 ENCOUNTER — Other Ambulatory Visit: Payer: Self-pay

## 2023-04-01 DIAGNOSIS — D472 Monoclonal gammopathy: Secondary | ICD-10-CM

## 2023-04-04 ENCOUNTER — Inpatient Hospital Stay: Payer: Medicare Other | Attending: Oncology

## 2023-04-04 ENCOUNTER — Inpatient Hospital Stay (HOSPITAL_BASED_OUTPATIENT_CLINIC_OR_DEPARTMENT_OTHER): Payer: Medicare Other | Admitting: Oncology

## 2023-04-04 ENCOUNTER — Encounter: Payer: Self-pay | Admitting: Oncology

## 2023-04-04 VITALS — BP 157/82 | HR 127 | Temp 98.8°F | Resp 18 | Wt 126.4 lb

## 2023-04-04 DIAGNOSIS — D472 Monoclonal gammopathy: Secondary | ICD-10-CM | POA: Diagnosis not present

## 2023-04-04 LAB — CMP (CANCER CENTER ONLY)
ALT: 36 U/L (ref 0–44)
AST: 34 U/L (ref 15–41)
Albumin: 4.5 g/dL (ref 3.5–5.0)
Alkaline Phosphatase: 109 U/L (ref 38–126)
Anion gap: 13 (ref 5–15)
BUN: 32 mg/dL — ABNORMAL HIGH (ref 8–23)
CO2: 25 mmol/L (ref 22–32)
Calcium: 9.8 mg/dL (ref 8.9–10.3)
Chloride: 101 mmol/L (ref 98–111)
Creatinine: 0.89 mg/dL (ref 0.44–1.00)
GFR, Estimated: >60 mL/min (ref >60)
Glucose, Bld: 114 mg/dL — ABNORMAL HIGH (ref 70–99)
Potassium: 4.4 mmol/L (ref 3.5–5.1)
Sodium: 139 mmol/L (ref 135–145)
Total Bilirubin: 0.2 mg/dL (ref <1.2)
Total Protein: 8.1 g/dL (ref 6.5–8.1)

## 2023-04-04 LAB — CBC WITH DIFFERENTIAL (CANCER CENTER ONLY)
Abs Immature Granulocytes: 0.03 10*3/uL (ref 0.00–0.07)
Basophils Absolute: 0 10*3/uL (ref 0.0–0.1)
Basophils Relative: 0 %
Eosinophils Absolute: 0.1 10*3/uL (ref 0.0–0.5)
Eosinophils Relative: 1 %
HCT: 40.2 % (ref 36.0–46.0)
Hemoglobin: 13.4 g/dL (ref 12.0–15.0)
Immature Granulocytes: 0 %
Lymphocytes Relative: 26 %
Lymphs Abs: 1.7 10*3/uL (ref 0.7–4.0)
MCH: 30.7 pg (ref 26.0–34.0)
MCHC: 33.3 g/dL (ref 30.0–36.0)
MCV: 92 fL (ref 80.0–100.0)
Monocytes Absolute: 0.4 10*3/uL (ref 0.1–1.0)
Monocytes Relative: 6 %
Neutro Abs: 4.5 10*3/uL (ref 1.7–7.7)
Neutrophils Relative %: 67 %
Platelet Count: 275 10*3/uL (ref 150–400)
RBC: 4.37 MIL/uL (ref 3.87–5.11)
RDW: 13.2 % (ref 11.5–15.5)
WBC Count: 6.7 10*3/uL (ref 4.0–10.5)
nRBC: 0 % (ref 0.0–0.2)

## 2023-04-05 LAB — KAPPA/LAMBDA LIGHT CHAINS
Kappa free light chain: 85.9 mg/L — ABNORMAL HIGH (ref 3.3–19.4)
Kappa, lambda light chain ratio: 6.61 — ABNORMAL HIGH (ref 0.26–1.65)
Lambda free light chains: 13 mg/L (ref 5.7–26.3)

## 2023-04-06 NOTE — Progress Notes (Signed)
Hematology/Oncology Consult note [facility]  Telephone:([phone] [phone]  Patient Care Team: Dana Allan, MD as PCP - General (Family Medicine) Creig Hines, MD as Consulting Physician (Oncology)   Name of the patient: Jamie Todd  [id number]  Sep 30, 1940   Date of visit: 04/06/23  Diagnosis-IgG kappa smoldering multiple myeloma  Chief complaint/ Reason for visit-follow-up of smoldering multiple myeloma  Heme/Onc history: patient is a 82 year old female with a past medical history significant for hyperlipidemia ulcerative colitis who has been referred for abnormal SPEPHer most recent urine protein electrophoresis did not show any evidence of M protein however there was a spike of 1 g noted on her myeloma panel.  It was reported that this may be a monoclonal protein but could be seen by circulating immune complexes, cryoglobulins or hemolysis.  Alkaline phosphatase mildly elevated at 133.  CBC was normal with an H&H of 12.8/38.8.  CMP showed a creatinine of 0.9.  Total protein normal at 7.6 calcium level normal at 9.8.  B12 levels were elevated.  Ferritin levels were34 with an iron saturation of 18% and TIBC of 421.   Patient was found to have an elevated serum free light chain At 57.2 and a ratio of 6.4. Myeloma panel showed elevated IgG of 1643 with a 1 g IgG kappa M protein which has remained stable since April 2023. She therefore underwent a bone marrow biopsy in May 2023.  Bone marrow biopsy shows 10 to 15% plasma cells by CD138 kappa restricted.  Bone survey does not reveal any evidence of lytic lesions.  She does not have any crab criteria which would define multiple myeloma.  Given that her bone marrow plasma cells are more than 10% this would put her under the category of smoldering multiple myeloma   Interval history-patient has chronic fatigue which is unchanged.  States that she was at a beach trip away and was walking around more and  more active than her usual self and since then she has been having some low back pain.  Is gradually getting better but persists.  She has chronic fatigue  ECOG PS- 1 Pain scale- 4   Review of systems- Review of Systems  Constitutional:  Positive for malaise/fatigue. Negative for chills, fever and weight loss.  HENT:  Negative for congestion, ear discharge and nosebleeds.   Eyes:  Negative for blurred vision.  Respiratory:  Negative for cough, hemoptysis, sputum production, shortness of breath and wheezing.   Cardiovascular:  Negative for chest pain, palpitations, orthopnea and claudication.  Gastrointestinal:  Negative for abdominal pain, blood in stool, constipation, diarrhea, heartburn, melena, nausea and vomiting.  Genitourinary:  Negative for dysuria, flank pain, frequency, hematuria and urgency.  Musculoskeletal:  Positive for back pain. Negative for joint pain and myalgias.  Skin:  Negative for rash.  Neurological:  Negative for dizziness, tingling, focal weakness, seizures, weakness and headaches.  Endo/Heme/Allergies:  Does not bruise/bleed easily.  Psychiatric/Behavioral:  Negative for depression and suicidal ideas. The patient does not have insomnia.       No Known Allergies   Past Medical History:  Diagnosis Date   Basal cell carcinoma 04/23/2014   Right frontal scalp within hair line. Nodular. Excised 06/18/2014, margins free   Breast pain, right 05/31/2017   Cat scratch fever    Chronic fatigue    Chronic migraine    as of 2020 resolved and no issues w/in the last 20 years per pt    Chronic sinusitis  Chronic ulcerative colitis, unspecified complication (HCC)    Complication of anesthesia    slow to wake up   COVID-19    04/2021   Depression    1975 to 1995 professional help sought   EBV infection    Endometriosis    GERD (gastroesophageal reflux disease)    Hemochromatosis    Hypercalcemia 08/09/2018   Hyperlipidemia    Idiopathic chronic inflammatory  bowel disease    Kidney cysts 08/05/2021   Measles    Miscarriage    Osteoporosis    Prediabetes    Pyloric stenosis    Skin cancer    basal and squamous cell    Squamous cell carcinoma of skin 07/19/2008   Right lat. lower leg. SCCis   Type A blood, Rh negative    Ulcerative colitis (HCC)    1975 to 1995 tx'ed with medical care    UTI (urinary tract infection)      Past Surgical History:  Procedure Laterality Date   CATARACT EXTRACTION     b/l   COLONOSCOPY WITH PROPOFOL N/A 08/25/2017   Procedure: COLONOSCOPY WITH PROPOFOL;  Surgeon: Midge Minium, MD;  Location: [facility];  Service: Endoscopy;  Laterality: N/A;  wants to be last patient   OTHER SURGICAL HISTORY     laparoscopy several, D&Cs 1970s/1980s     Social History   Socioeconomic History   Marital status: Divorced    Spouse name: Not on file   Number of children: Not on file   Years of education: Not on file   Highest education level: Not on file  Occupational History   Not on file  Tobacco Use   Smoking status: Never   Smokeless tobacco: Never  Vaping Use   Vaping status: Never Used  Substance and Sexual Activity   Alcohol use: Yes    Comment: rarely   Drug use: No   Sexual activity: Never  Other Topics Concern   Not on file  Social History Narrative   Grad school    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 she was legally blind and deaf and heart problems and dementia x 8 years but mom died    She likes animals and has therapy dogs    From [place]   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    Social Determinants of Health   Financial Resource Strain: Low Risk  (01/18/2022)   Overall Financial Resource Strain (CARDIA)    Difficulty of Paying Living Expenses: Not hard at all  Food Insecurity: No Food Insecurity (01/18/2022)   Hunger Vital Sign    Worried About Running Out of Food in the Last Year: Never true    Ran Out of Food in the Last Year: Never true  Transportation Needs: No  Transportation Needs (01/18/2022)   PRAPARE - Administrator, Civil Service (Medical): No    Lack of Transportation (Non-Medical): No  Physical Activity: Not on file  Stress: No Stress Concern Present (01/18/2022)   [employer] of Occupational Health - Occupational Stress Questionnaire    Feeling of Stress : Only a little  Social Connections: Unknown (01/18/2022)   Social Connection and Isolation Panel [NHANES]    Frequency of Communication with Friends and Family: More than three times a week    Frequency of Social Gatherings with Friends and Family: More than three times a week    Attends Religious Services: Not on [employer] of Clubs or  Organizations: Not on file    Attends Club or Organization Meetings: Not on file    Marital Status: Not on file  Intimate Partner Violence: Not At Risk (01/18/2022)   Humiliation, Afraid, Rape, and Kick questionnaire    Fear of Current or Ex-Partner: No    Emotionally Abused: No    Physically Abused: No    Sexually Abused: No    Family History  Problem Relation Age of Onset   Dementia Mother    Heart disease Mother        died at 67   Deafness Mother    Blindness Mother    Diabetes Other        both sides of family      Current Outpatient Medications:    acetaminophen (TYLENOL) 325 MG tablet, Take 650 mg by mouth every 6 (six) hours as needed., Disp: , Rfl:    acidophilus (RISAQUAD) CAPS capsule, Take by mouth daily. (Patient not taking: Reported on 12/28/2022), Disp: , Rfl:    b complex vitamins capsule, 1 capsule., Disp: , Rfl:    Calcium Citrate (CITRACAL PO), Take 1 capsule by mouth daily., Disp: , Rfl:    cetirizine-pseudoephedrine (ZYRTEC-D) 5-120 MG tablet, Take 1 tablet by mouth daily., Disp: , Rfl:    Cholecalciferol (VITAMIN D3) 25 MCG (1000 UT) CAPS, Take 1 capsule by mouth daily., Disp: , Rfl:    Cranberry 1000 MG CAPS, Take 1 capsule by mouth daily., Disp: , Rfl:    DULoxetine (CYMBALTA) 30 MG  capsule, Take 1 capsule (30 mg total) by mouth daily., Disp: 30 capsule, Rfl: 0   fluticasone (FLONASE) 50 MCG/ACT nasal spray, Place 2 sprays into both nostrils daily., Disp: , Rfl: 0   Misc Natural Products (NEURIVA PO), Take by mouth., Disp: , Rfl:    Multiple Vitamin (MULTIVITAMIN) tablet, Take 1 tablet by mouth daily., Disp: , Rfl:    pravastatin (PRAVACHOL) 10 MG tablet, Take 1 tablet (10 mg total) by mouth daily., Disp: 90 tablet, Rfl: 3  Physical exam:  Vitals:   04/04/23 1422  BP: (!) 157/82  Pulse: (!) 127  Resp: 18  Temp: 98.8 F (37.1 C)  TempSrc: Tympanic  SpO2: 98%  Weight: 126 lb 6.4 oz (57.3 kg)   Physical Exam Cardiovascular:     Rate and Rhythm: Normal rate and regular rhythm.     Heart sounds: Normal heart sounds.  Pulmonary:     Effort: Pulmonary effort is normal.     Breath sounds: Normal breath sounds.  Skin:    General: Skin is warm and dry.  Neurological:     Mental Status: She is alert and oriented to person, place, and time.         Latest Ref Rng & Units 04/04/2023    1:58 PM  CMP  Glucose 70 - 99 mg/dL 161   BUN 8 - 23 mg/dL 32   Creatinine 0.96 - 1.00 mg/dL 0.45   Sodium 409 - 811 mmol/L 139   Potassium 3.5 - 5.1 mmol/L 4.4   Chloride 98 - 111 mmol/L 101   CO2 22 - 32 mmol/L 25   Calcium 8.9 - 10.3 mg/dL 9.8   Total Protein 6.5 - 8.1 g/dL 8.1   Total Bilirubin <9.1 mg/dL 0.2   Alkaline Phos 38 - 126 U/L 109   AST 15 - 41 U/L 34   ALT 0 - 44 U/L 36       Latest Ref Rng & Units 04/04/2023  1:57 PM  CBC  WBC 4.0 - 10.5 K/uL 6.7   Hemoglobin 12.0 - 15.0 g/dL 62.9   Hematocrit 52.8 - 46.0 % 40.2   Platelets 150 - 400 K/uL 275      Assessment and plan- Patient is a 82 y.o. female here for routine follow-up of low risk IgG kappa smoldering multiple myeloma  Patient currently does not have any evidence of anemia hypercalcemia or renal failure.She had a bone survey in May 2024 which did not show any evidence of lytic bone lesions.   Have asked her to see how her back pain evolves.  If back pain persists over the next few weeks she will let us know and I will consider getting a PET scan to rule out any lytic bone lesions.  Overall her M protein has remained stable around 1 for the last 1 year.  Serum free light chain ratio has been stable around 6.  Myeloma panel from today is pending.  Given the stability of her counts I will continue checking CBC with differential CMP myeloma panel and serum free light chains in 6 months in 1 year and see her back in 1 year   Visit Diagnosis 1. Smoldering multiple myeloma      Dr. Owens Shark, MD, MPH [facility] at [facility] [phone] 04/06/2023 8:17 AM

## 2023-04-11 LAB — MULTIPLE MYELOMA PANEL, SERUM
Albumin SerPl Elph-Mcnc: 4.3 g/dL (ref 2.9–4.4)
Albumin/Glob SerPl: 1.4 (ref 0.7–1.7)
Alpha 1: 0.2 g/dL (ref 0.0–0.4)
Alpha2 Glob SerPl Elph-Mcnc: 0.8 g/dL (ref 0.4–1.0)
B-Globulin SerPl Elph-Mcnc: 1 g/dL (ref 0.7–1.3)
Gamma Glob SerPl Elph-Mcnc: 1.3 g/dL (ref 0.4–1.8)
Globulin, Total: 3.3 g/dL (ref 2.2–3.9)
IgA: 38 mg/dL — ABNORMAL LOW (ref 64–422)
IgG (Immunoglobin G), Serum: 1570 mg/dL (ref 586–1602)
IgM (Immunoglobulin M), Srm: 41 mg/dL (ref 26–217)
M Protein SerPl Elph-Mcnc: 0.9 g/dL — ABNORMAL HIGH
Total Protein ELP: 7.6 g/dL (ref 6.0–8.5)

## 2023-05-12 NOTE — Telephone Encounter (Signed)
 error

## 2023-06-30 ENCOUNTER — Telehealth: Payer: Self-pay | Admitting: Family Medicine

## 2023-06-30 NOTE — Telephone Encounter (Signed)
 Copied from CRM [id number]. Topic: Medicare AWV >> Jun 30, 2023  1:54 PM Payton Doughty wrote: Reason for CRM: Called 06/30/2023 to sched AWV - MAILBOX FULL  Verlee Rossetti; Care Guide Ambulatory Clinical Support [place] [facility] Direct Dial: [phone]

## 2023-09-07 IMAGING — CR DG BONE SURVEY MET
1 series · 10 of 10 positions shown · non-contrast
Comparison: February 24, 2021.

CLINICAL DATA: Abnormal serum plasma electrophoresis protein.

EXAM:
METASTATIC BONE SURVEY

[Series 1: dg bone survey met · 0.14mm/px · 10 of 21 slices shown]
[im 1/21]
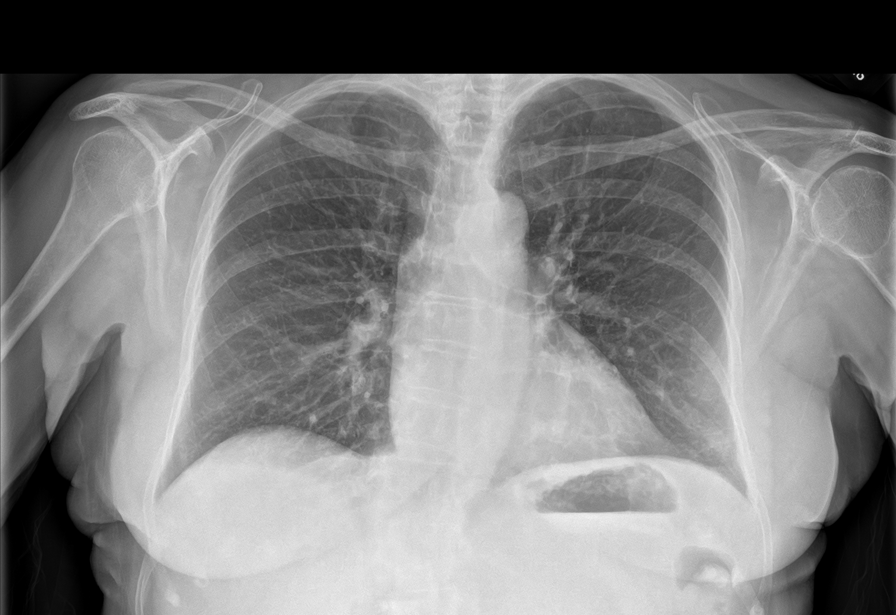
[im 3/21]
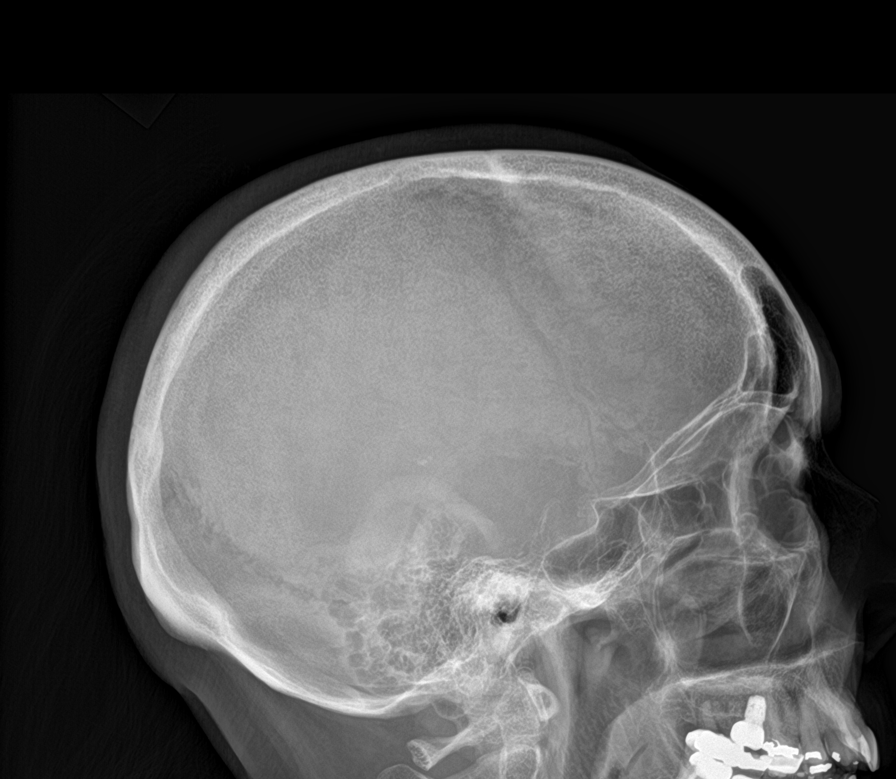
[im 5/21]
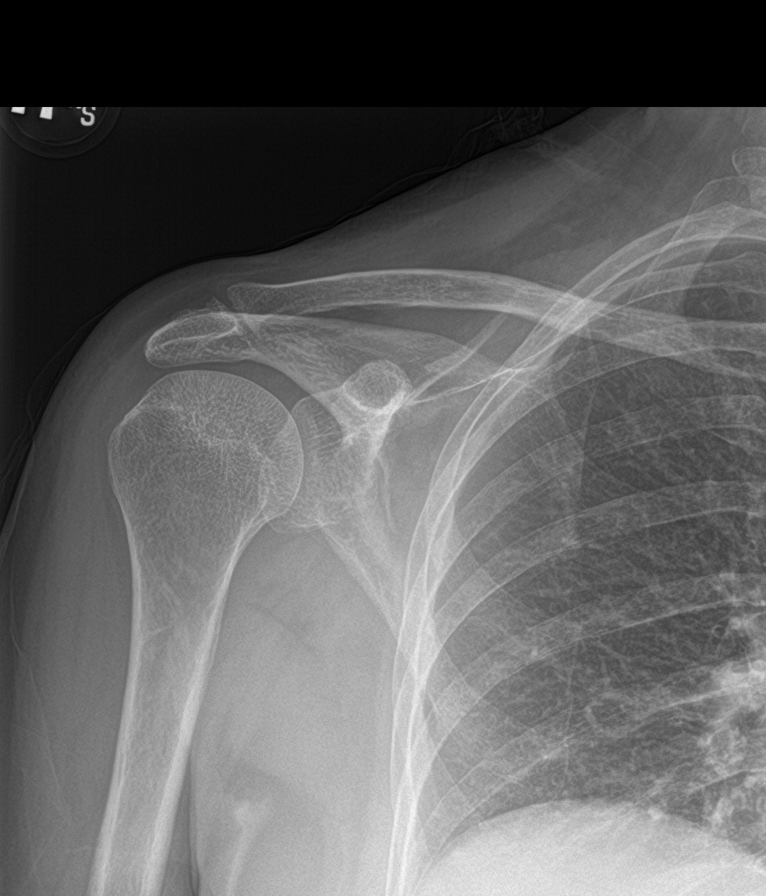
[im 7/21]
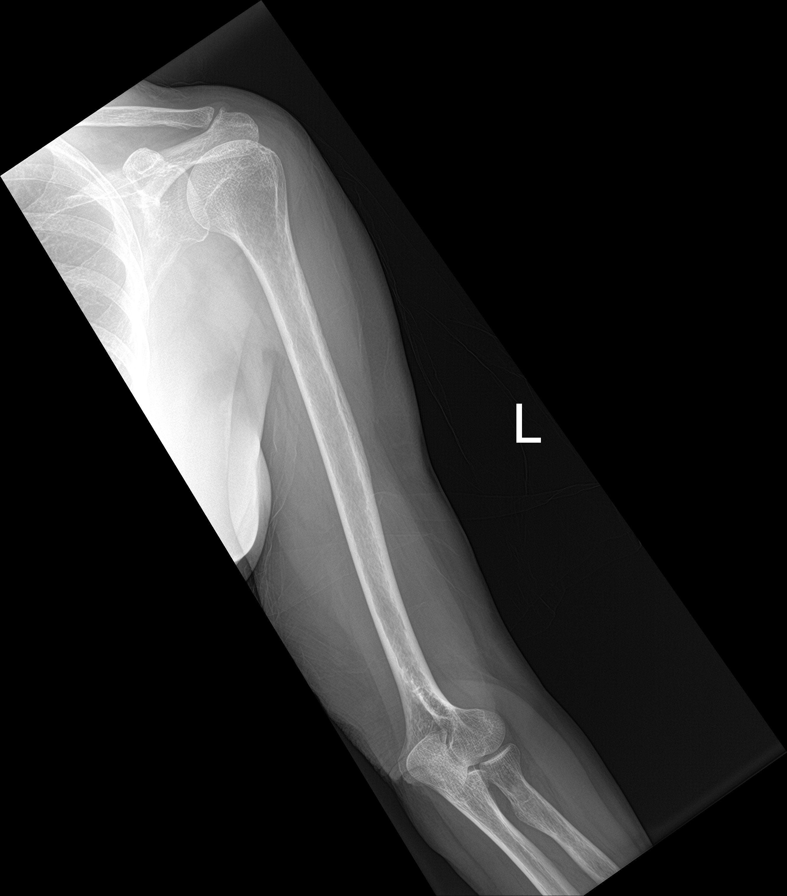
[im 9/21]
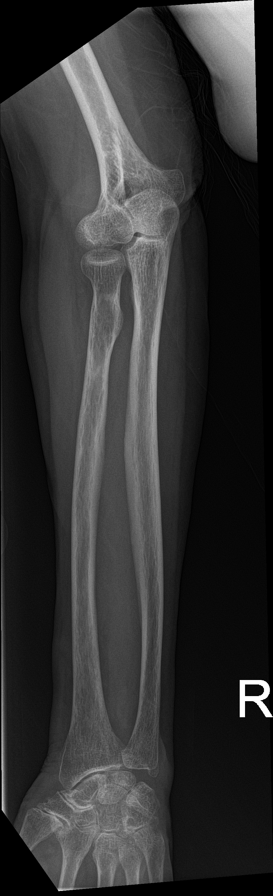
[im 12/21]
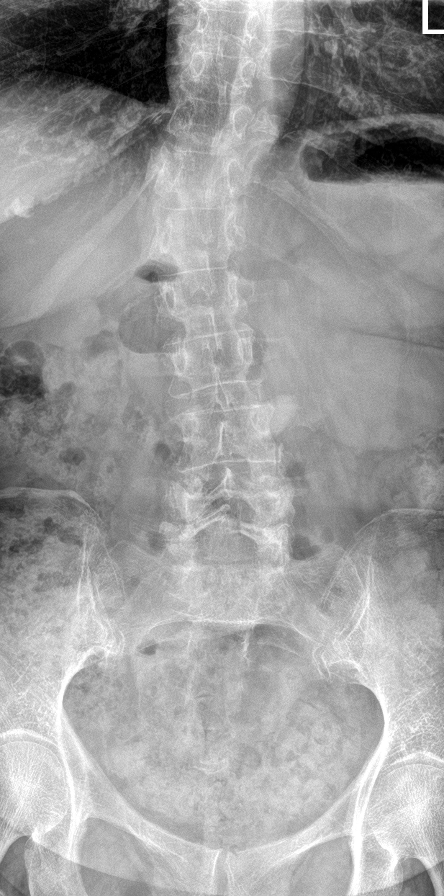
[im 14/21]
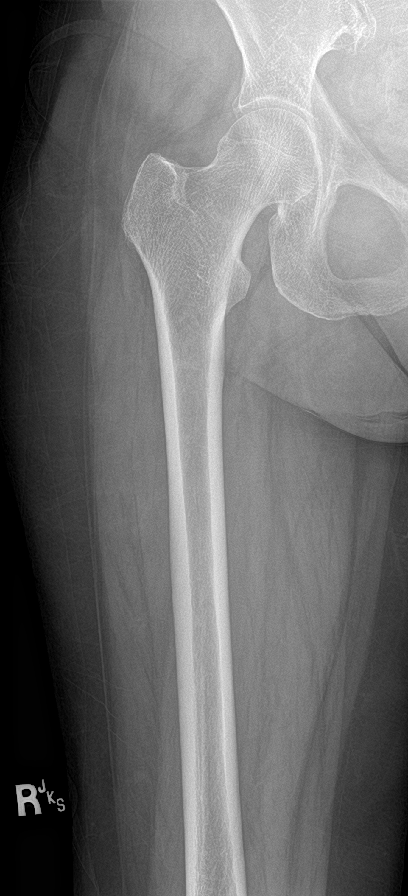
[im 16/21]
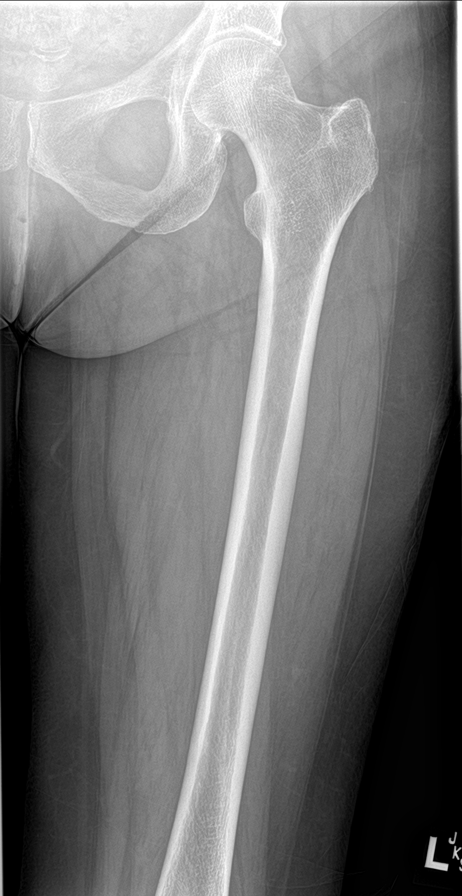
[im 18/21]
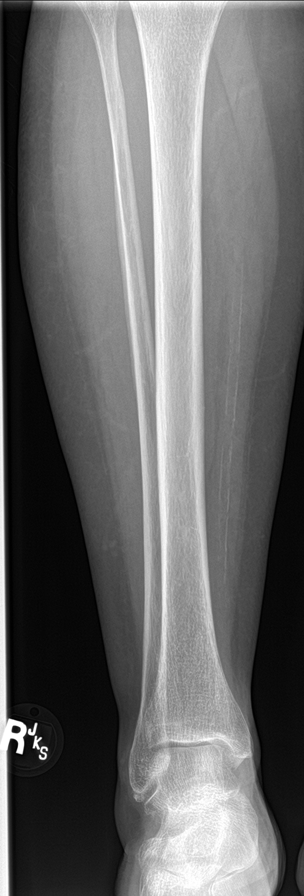
[im 21/21]
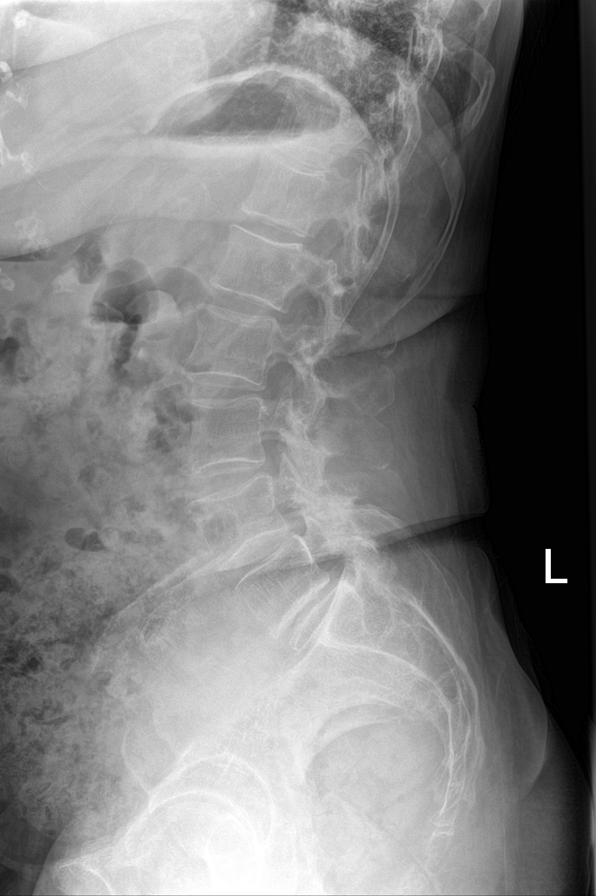

[10 of 10 positions shown; findings below may reference images not displayed]

FINDINGS: No definite lytic or sclerotic lesion is seen involving the
skeleton. Mild multilevel degenerative changes are noted in the
cervical spine. No fracture or dislocation is noted.
IMPRESSION: No definite lytic or sclerotic lesions are seen in the skeleton.

## 2023-10-04 ENCOUNTER — Other Ambulatory Visit: Payer: Medicare Other

## 2023-10-06 ENCOUNTER — Inpatient Hospital Stay: Payer: Medicare Other | Attending: Oncology

## 2023-12-16 ENCOUNTER — Encounter: Payer: Self-pay | Admitting: Family Medicine

## 2023-12-16 ENCOUNTER — Telehealth: Payer: Self-pay | Admitting: Family Medicine

## 2023-12-16 NOTE — Telephone Encounter (Signed)
 Letter mailed out to call office to schedule TOC.

## 2024-01-10 ENCOUNTER — Ambulatory Visit: Payer: Medicare Other | Admitting: Dermatology

## 2024-03-29 ENCOUNTER — Telehealth: Payer: Self-pay | Admitting: Oncology

## 2024-03-29 NOTE — Telephone Encounter (Signed)
 Left message with patient about her appt on 11/25 we had to reschedule due to a provider family emergency. It has been moved to 12/2 I asked her to call back if this did not work or if she had any questions.

## 2024-04-03 ENCOUNTER — Other Ambulatory Visit: Payer: Medicare Other

## 2024-04-03 ENCOUNTER — Ambulatory Visit: Payer: Medicare Other | Admitting: Nurse Practitioner

## 2024-04-09 ENCOUNTER — Telehealth: Payer: Self-pay | Admitting: Nurse Practitioner

## 2024-04-09 ENCOUNTER — Other Ambulatory Visit: Payer: Self-pay

## 2024-04-09 DIAGNOSIS — D472 Monoclonal gammopathy: Secondary | ICD-10-CM

## 2024-04-09 NOTE — Telephone Encounter (Signed)
 Patient appointment needs to be moved from 12/2 per provider request. Called patient on 11/29 and 12/1 with no answer. Unable to leave a voicemail with either number listed. Will attempt again.

## 2024-04-10 ENCOUNTER — Inpatient Hospital Stay: Admitting: Nurse Practitioner

## 2024-04-10 ENCOUNTER — Inpatient Hospital Stay: Attending: Nurse Practitioner

## 2024-04-10 DIAGNOSIS — D472 Monoclonal gammopathy: Secondary | ICD-10-CM | POA: Insufficient documentation

## 2024-04-10 DIAGNOSIS — G9332 Myalgic encephalomyelitis/chronic fatigue syndrome: Secondary | ICD-10-CM | POA: Insufficient documentation

## 2024-04-10 DIAGNOSIS — Z23 Encounter for immunization: Secondary | ICD-10-CM | POA: Insufficient documentation

## 2024-04-11 ENCOUNTER — Encounter: Payer: Self-pay | Admitting: Nurse Practitioner

## 2024-04-26 ENCOUNTER — Inpatient Hospital Stay: Admitting: Nurse Practitioner

## 2024-04-26 ENCOUNTER — Inpatient Hospital Stay

## 2024-04-26 VITALS — BP 136/67 | HR 91 | Temp 97.8°F | Resp 16 | Wt 118.0 lb

## 2024-04-26 DIAGNOSIS — D472 Monoclonal gammopathy: Secondary | ICD-10-CM

## 2024-04-26 DIAGNOSIS — Z23 Encounter for immunization: Secondary | ICD-10-CM

## 2024-04-26 DIAGNOSIS — G9332 Myalgic encephalomyelitis/chronic fatigue syndrome: Secondary | ICD-10-CM | POA: Diagnosis not present

## 2024-04-26 LAB — CBC WITH DIFFERENTIAL (CANCER CENTER ONLY)
Abs Immature Granulocytes: 0.03 K/uL (ref 0.00–0.07)
Basophils Absolute: 0 K/uL (ref 0.0–0.1)
Basophils Relative: 0 %
Eosinophils Absolute: 0.1 K/uL (ref 0.0–0.5)
Eosinophils Relative: 1 %
HCT: 38.6 % (ref 36.0–46.0)
Hemoglobin: 12.6 g/dL (ref 12.0–15.0)
Immature Granulocytes: 0 %
Lymphocytes Relative: 19 %
Lymphs Abs: 1.4 K/uL (ref 0.7–4.0)
MCH: 30.3 pg (ref 26.0–34.0)
MCHC: 32.6 g/dL (ref 30.0–36.0)
MCV: 92.8 fL (ref 80.0–100.0)
Monocytes Absolute: 0.4 K/uL (ref 0.1–1.0)
Monocytes Relative: 5 %
Neutro Abs: 5.2 K/uL (ref 1.7–7.7)
Neutrophils Relative %: 75 %
Platelet Count: 282 K/uL (ref 150–400)
RBC: 4.16 MIL/uL (ref 3.87–5.11)
RDW: 13.5 % (ref 11.5–15.5)
WBC Count: 7.1 K/uL (ref 4.0–10.5)
nRBC: 0 % (ref 0.0–0.2)

## 2024-04-26 LAB — CMP (CANCER CENTER ONLY)
ALT: 38 U/L (ref 0–44)
AST: 32 U/L (ref 15–41)
Albumin: 4.6 g/dL (ref 3.5–5.0)
Alkaline Phosphatase: 116 U/L (ref 38–126)
Anion gap: 11 (ref 5–15)
BUN: 32 mg/dL — ABNORMAL HIGH (ref 8–23)
CO2: 25 mmol/L (ref 22–32)
Calcium: 10.1 mg/dL (ref 8.9–10.3)
Chloride: 102 mmol/L (ref 98–111)
Creatinine: 0.8 mg/dL (ref 0.44–1.00)
GFR, Estimated: >60 mL/min (ref >60)
Glucose, Bld: 123 mg/dL — ABNORMAL HIGH (ref 70–99)
Potassium: 4.2 mmol/L (ref 3.5–5.1)
Sodium: 138 mmol/L (ref 135–145)
Total Bilirubin: 0.4 mg/dL (ref 0.0–1.2)
Total Protein: 7.9 g/dL (ref 6.5–8.1)

## 2024-04-26 MED ADMIN — Influenza Virus Vac Split High-Dose PF Susp Pref Syr 0.5ML: 0.5 mL | INTRAMUSCULAR | @ 11:00:00 | NDC 49281012588

## 2024-04-26 MED FILL — Influenza Virus Vac Split High-Dose PF Susp Pref Syr 0.5ML: 0.5000 mL | INTRAMUSCULAR | Qty: 0.5 | Status: AC

## 2024-04-26 NOTE — Progress Notes (Signed)
 Patient here for 1 year follow up. Patient denies any new concerns at this time. Per patient states she is not taking any meds.

## 2024-04-26 NOTE — Progress Notes (Signed)
 Hematology/Oncology Consult Note [facility]  Telephone:([phone] Fax:([phone]  Patient Care Team: Melanee Annah BROCKS, MD as Consulting Physician (Oncology)   Name of the patient: Jamie Todd  [id number]  10-14-1940   Date of visit: 04/26/2024  Diagnosis- IgG kappa smoldering multiple myeloma  Chief complaint/Reason for visit- follow-up of smoldering multiple myeloma  Heme/Onc history: patient is a 83 year old female with a past medical history significant for hyperlipidemia ulcerative colitis who has been referred for abnormal SPEPHer most recent urine protein electrophoresis did not show any evidence of M protein however there was a spike of 1 g noted on her myeloma panel.  It was reported that this may be a monoclonal protein but could be seen by circulating immune complexes, cryoglobulins or hemolysis.  Alkaline phosphatase mildly elevated at 133.  CBC was normal with an H&H of 12.8/38.8.  CMP showed a creatinine of 0.9.  Total protein normal at 7.6 calcium level normal at 9.8.  B12 levels were elevated.  Ferritin levels were34 with an iron saturation of 18% and TIBC of 421.   Patient was found to have an elevated serum free light chain At 57.2 and a ratio of 6.4. Myeloma panel showed elevated IgG of 1643 with a 1 g IgG kappa M protein which has remained stable since April 2023. She therefore underwent a bone marrow biopsy in May 2023.  Bone marrow biopsy shows 10 to 15% plasma cells by CD138 kappa restricted.  Bone survey does not reveal any evidence of lytic lesions.  She does not have any crab criteria which would define multiple myeloma.  Given that her bone marrow plasma cells are more than 10% this would put her under the category of smoldering multiple myeloma   Interval history- Jamie Todd is a 83 y.o. female with smoldering myeloma who returns to clinic for follow up. She feels that her chronic fatigue syndrome has been exacerbated lately. Was  previously managed by MD in [place]  when she lived there. Is awaiting appt with gerontologist locally. She denies new aches or pains.   ECOG PS- 1 Pain scale- 0  Review of systems- Review of Systems  Constitutional:  Positive for malaise/fatigue. Negative for chills, fever and weight loss.  HENT:  Negative for congestion, ear discharge and nosebleeds.   Eyes:  Negative for blurred vision.  Respiratory:  Negative for cough, hemoptysis, sputum production, shortness of breath and wheezing.   Cardiovascular:  Negative for chest pain, palpitations, orthopnea and claudication.  Gastrointestinal:  Negative for abdominal pain, blood in stool, constipation, diarrhea, heartburn, melena, nausea and vomiting.  Genitourinary:  Negative for dysuria, flank pain, frequency, hematuria and urgency.  Musculoskeletal:  Negative for back pain, falls, joint pain and myalgias.  Skin:  Negative for rash.  Neurological:  Negative for dizziness, tingling, focal weakness, seizures, weakness and headaches.  Endo/Heme/Allergies:  Does not bruise/bleed easily.  Psychiatric/Behavioral:  Negative for depression and suicidal ideas. The patient does not have insomnia.     No Known Allergies  Past Medical History:  Diagnosis Date   Basal cell carcinoma 04/23/2014   Right frontal scalp within hair line. Nodular. Excised 06/18/2014, margins free   Breast pain, right 05/31/2017   Cat scratch fever    Chronic fatigue    Chronic migraine    as of 2020 resolved and no issues w/in the last 20 years per pt    Chronic sinusitis    Chronic ulcerative colitis, unspecified complication (HCC)    Complication of  anesthesia    slow to wake up   COVID-19    04/2021   Depression    1975 to 1995 professional help sought   EBV infection    Endometriosis    GERD (gastroesophageal reflux disease)    Hemochromatosis    Hypercalcemia 08/09/2018   Hyperlipidemia    Idiopathic chronic inflammatory bowel disease    Kidney cysts  08/05/2021   Measles    Miscarriage    Osteoporosis    Prediabetes    Pyloric stenosis    Skin cancer    basal and squamous cell    Squamous cell carcinoma of skin 07/19/2008   Right lat. lower leg. SCCis   Type A blood, Rh negative    Ulcerative colitis (HCC)    1975 to 1995 tx'ed with medical care    UTI (urinary tract infection)    Past Surgical History:  Procedure Laterality Date   CATARACT EXTRACTION     b/l   COLONOSCOPY WITH PROPOFOL  N/A 08/25/2017   Procedure: COLONOSCOPY WITH PROPOFOL ;  Surgeon: Jinny Carmine, MD;  Location: [facility];  Service: Endoscopy;  Laterality: N/A;  wants to be last patient   OTHER SURGICAL HISTORY     laparoscopy several, D&Cs 1970s/1980s    Social History   Socioeconomic History   Marital status: Divorced    Spouse name: Not on file   Number of children: Not on file   Years of education: Not on file   Highest education level: Not on file  Occupational History   Not on file  Tobacco Use   Smoking status: Never   Smokeless tobacco: Never  Vaping Use   Vaping status: Never Used  Substance and Sexual Activity   Alcohol use: Yes    Comment: rarely   Drug use: No   Sexual activity: Never  Other Topics Concern   Not on file  Social History Narrative   Grad school    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 she was legally blind and deaf and heart problems and dementia x 8 years but mom died    She likes animals and has therapy dogs    From [place]   Former pre K teacher in 2017    Social Drivers of Health   Tobacco Use: Low Risk (04/26/2024)   Patient History    Smoking Tobacco Use: Never    Smokeless Tobacco Use: Never    Passive Exposure: Not on file  Financial Resource Strain: Low Risk (01/18/2022)   Overall Financial Resource Strain (CARDIA)    Difficulty of Paying Living Expenses: Not hard at all  Food Insecurity: No Food Insecurity (01/18/2022)   Hunger Vital Sign    Worried About Running Out of  Food in the Last Year: Never true    Ran Out of Food in the Last Year: Never true  Transportation Needs: No Transportation Needs (01/18/2022)   PRAPARE - Administrator, Civil Service (Medical): No    Lack of Transportation (Non-Medical): No  Physical Activity: Not on file  Stress: No Stress Concern Present (01/18/2022)   [employer] of Occupational Health - Occupational Stress Questionnaire    Feeling of Stress : Only a little  Social Connections: Unknown (01/18/2022)   Social Connection and Isolation Panel    Frequency of Communication with Friends and Family: More than three times a week    Frequency of Social Gatherings with Friends and Family: More than three times a week  Attends Religious Services: Not on file    Active Member of Clubs or Organizations: Not on file    Attends Club or Organization Meetings: Not on file    Marital Status: Not on file  Intimate Partner Violence: Not At Risk (01/18/2022)   Humiliation, Afraid, Rape, and Kick questionnaire    Fear of Current or Ex-Partner: No    Emotionally Abused: No    Physically Abused: No    Sexually Abused: No  Depression (PHQ2-9): Low Risk (04/26/2024)   Depression (PHQ2-9)    PHQ-2 Score: 0  Alcohol Screen: Not on file  Housing: Low Risk (01/18/2022)   Housing    Last Housing Risk Score: 0  Utilities: Not At Risk (01/18/2022)   AHC Utilities    Threatened with loss of utilities: No  Health Literacy: Not on file   Family History  Problem Relation Age of Onset   Dementia Mother    Heart disease Mother        died at 38   Deafness Mother    Blindness Mother    Diabetes Other        both sides of family     Current Outpatient Medications:    acetaminophen  (TYLENOL ) 325 MG tablet, Take 650 mg by mouth every 6 (six) hours as needed., Disp: , Rfl:    Multiple Vitamin (MULTIVITAMIN) tablet, Take 1 tablet by mouth daily., Disp: , Rfl:    acidophilus (RISAQUAD) CAPS capsule, Take by mouth daily. (Patient  not taking: Reported on 12/28/2022), Disp: , Rfl:    b complex vitamins capsule, 1 capsule. (Patient not taking: Reported on 04/26/2024), Disp: , Rfl:    Calcium Citrate (CITRACAL PO), Take 1 capsule by mouth daily. (Patient not taking: Reported on 04/26/2024), Disp: , Rfl:    cetirizine-pseudoephedrine (ZYRTEC-D) 5-120 MG tablet, Take 1 tablet by mouth daily. (Patient not taking: Reported on 04/26/2024), Disp: , Rfl:    Cholecalciferol (VITAMIN D3) 25 MCG (1000 UT) CAPS, Take 1 capsule by mouth daily. (Patient not taking: Reported on 04/26/2024), Disp: , Rfl:    Cranberry 1000 MG CAPS, Take 1 capsule by mouth daily. (Patient not taking: Reported on 04/26/2024), Disp: , Rfl:    DULoxetine  (CYMBALTA ) 30 MG capsule, Take 1 capsule (30 mg total) by mouth daily. (Patient not taking: Reported on 04/26/2024), Disp: 30 capsule, Rfl: 0   fluticasone (FLONASE) 50 MCG/ACT nasal spray, Place 2 sprays into both nostrils daily. (Patient not taking: Reported on 04/26/2024), Disp: , Rfl: 0   Misc Natural Products (NEURIVA PO), Take by mouth. (Patient not taking: Reported on 04/26/2024), Disp: , Rfl:    pravastatin  (PRAVACHOL ) 10 MG tablet, Take 1 tablet (10 mg total) by mouth daily. (Patient not taking: Reported on 04/26/2024), Disp: 90 tablet, Rfl: 3  Physical exam:  Vitals:   04/26/24 1030  BP: 136/67  Pulse: 91  Resp: 16  Temp: 97.8 F (36.6 C)  TempSrc: Tympanic  SpO2: 99%  Weight: 118 lb (53.5 kg)   Physical Exam Vitals reviewed.  Constitutional:      Appearance: She is not ill-appearing.  Cardiovascular:     Rate and Rhythm: Normal rate and regular rhythm.  Pulmonary:     Effort: Pulmonary effort is normal.     Breath sounds: Normal breath sounds.  Skin:    General: Skin is warm and dry.  Neurological:     Mental Status: She is alert and oriented to person, place, and time.  Psychiatric:  Mood and Affect: Mood normal.        Behavior: Behavior normal.        Latest Ref Rng &  Units 04/26/2024   10:11 AM  CMP  Glucose 70 - 99 mg/dL 876   BUN 8 - 23 mg/dL 32   Creatinine 9.55 - 1.00 mg/dL 9.19   Sodium 864 - 854 mmol/L 138   Potassium 3.5 - 5.1 mmol/L 4.2   Chloride 98 - 111 mmol/L 102   CO2 22 - 32 mmol/L 25   Calcium 8.9 - 10.3 mg/dL 89.8   Total Protein 6.5 - 8.1 g/dL 7.9   Total Bilirubin 0.0 - 1.2 mg/dL 0.4   Alkaline Phos 38 - 126 U/L 116   AST 15 - 41 U/L 32   ALT 0 - 44 U/L 38       Latest Ref Rng & Units 04/26/2024   10:11 AM  CBC  WBC 4.0 - 10.5 K/uL 7.1   Hemoglobin 12.0 - 15.0 g/dL 87.3   Hematocrit 63.9 - 46.0 % 38.6   Platelets 150 - 400 K/uL 282     Assessment and plan- Patient is a 83 y.o. female here for routine follow-up of low risk IgG kappa smoldering multiple myeloma  Patient currently does not have any evidence of anemia hypercalcemia or renal failure. She had a bone survey in May 2024 which did not show any evidence of lytic bone lesions. Her back pain has now resolved. We discussed that if she develops increasing back pain would consider getting a pet to rule out lytic bone lesions. Overall her M protein has remained stable around 1. Serum free light chain ratio has been stable around 6. Myeloma panel from today is pending. We will plan for labs in 6 months and see her back in 1 year with repeat labs.   6 mo- labs (cbc, cmp, multiple myeloma panel, kappa lambda light chains) 1 year- labs (cbc, cmp, multiple myeloma panel, kappa lambda light chains), Dr Melanee- la  Visit Diagnosis 1. Smoldering multiple myeloma    Tinnie Dawn, DNP, AGNP-C, [facility] at [facility] [phone] (clinic) 04/26/2024

## 2024-04-27 LAB — KAPPA/LAMBDA LIGHT CHAINS
Kappa free light chain: 72 mg/L — ABNORMAL HIGH (ref 3.3–19.4)
Kappa, lambda light chain ratio: 5.45 — ABNORMAL HIGH (ref 0.26–1.65)
Lambda free light chains: 13.2 mg/L (ref 5.7–26.3)

## 2024-04-28 LAB — MULTIPLE MYELOMA PANEL, SERUM
Albumin SerPl Elph-Mcnc: 3.8 g/dL (ref 2.9–4.4)
Albumin/Glob SerPl: 1.2 (ref 0.7–1.7)
Alpha 1: 0.2 g/dL (ref 0.0–0.4)
Alpha2 Glob SerPl Elph-Mcnc: 0.8 g/dL (ref 0.4–1.0)
B-Globulin SerPl Elph-Mcnc: 1.1 g/dL (ref 0.7–1.3)
Gamma Glob SerPl Elph-Mcnc: 1.3 g/dL (ref 0.4–1.8)
Globulin, Total: 3.4 g/dL (ref 2.2–3.9)
IgA: 36 mg/dL — ABNORMAL LOW (ref 64–422)
IgG (Immunoglobin G), Serum: 1446 mg/dL (ref 586–1602)
IgM (Immunoglobulin M), Srm: 31 mg/dL (ref 26–217)
M Protein SerPl Elph-Mcnc: 0.9 g/dL — ABNORMAL HIGH
Total Protein ELP: 7.2 g/dL (ref 6.0–8.5)

## 2024-10-31 ENCOUNTER — Inpatient Hospital Stay

## 2025-04-24 ENCOUNTER — Inpatient Hospital Stay

## 2025-04-24 ENCOUNTER — Inpatient Hospital Stay: Admitting: Oncology
